# Patient Record
Sex: Female | Born: 1940 | Race: White | Hispanic: No | Smoking: Former smoker
Health system: Southern US, Community
[De-identification: ages and names within clinical notes are randomized; demographics above are authoritative.]

## PROBLEM LIST (undated history)

## (undated) DIAGNOSIS — I10 Essential (primary) hypertension: Secondary | ICD-10-CM

## (undated) DIAGNOSIS — F0390 Unspecified dementia without behavioral disturbance: Secondary | ICD-10-CM

## (undated) DIAGNOSIS — E785 Hyperlipidemia, unspecified: Secondary | ICD-10-CM

## (undated) DIAGNOSIS — N814 Uterovaginal prolapse, unspecified: Secondary | ICD-10-CM

## (undated) DIAGNOSIS — I059 Rheumatic mitral valve disease, unspecified: Secondary | ICD-10-CM

## (undated) DIAGNOSIS — F419 Anxiety disorder, unspecified: Secondary | ICD-10-CM

## (undated) DIAGNOSIS — N952 Postmenopausal atrophic vaginitis: Secondary | ICD-10-CM

## (undated) HISTORY — DX: Postmenopausal atrophic vaginitis: N95.2

## (undated) HISTORY — DX: Hyperlipidemia, unspecified: E78.5

## (undated) HISTORY — DX: Uterovaginal prolapse, unspecified: N81.4

## (undated) HISTORY — PX: CATARACT EXTRACTION, BILATERAL: SHX1313

## (undated) HISTORY — PX: TONSILLECTOMY AND ADENOIDECTOMY: SHX28

## (undated) HISTORY — DX: Anxiety disorder, unspecified: F41.9

## (undated) HISTORY — DX: Rheumatic mitral valve disease, unspecified: I05.9

## (undated) HISTORY — DX: Essential (primary) hypertension: I10

## (undated) HISTORY — PX: WISDOM TOOTH EXTRACTION: SHX21

## (undated) HISTORY — PX: SKIN BIOPSY: SHX1

## (undated) HISTORY — PX: TUBAL LIGATION: SHX77

---

## 1998-09-18 ENCOUNTER — Other Ambulatory Visit: Admission: RE | Admit: 1998-09-18 | Discharge: 1998-09-18 | Payer: Self-pay | Admitting: Obstetrics and Gynecology

## 1998-09-25 ENCOUNTER — Ambulatory Visit (HOSPITAL_COMMUNITY): Admission: RE | Admit: 1998-09-25 | Discharge: 1998-09-25 | Payer: Self-pay | Admitting: Family Medicine

## 1998-09-25 ENCOUNTER — Encounter: Payer: Self-pay | Admitting: Family Medicine

## 1999-11-09 ENCOUNTER — Other Ambulatory Visit: Admission: RE | Admit: 1999-11-09 | Discharge: 1999-11-09 | Payer: Self-pay | Admitting: Obstetrics and Gynecology

## 2000-11-16 ENCOUNTER — Other Ambulatory Visit: Admission: RE | Admit: 2000-11-16 | Discharge: 2000-11-16 | Payer: Self-pay | Admitting: Obstetrics and Gynecology

## 2001-11-21 ENCOUNTER — Other Ambulatory Visit: Admission: RE | Admit: 2001-11-21 | Discharge: 2001-11-21 | Payer: Self-pay | Admitting: Obstetrics and Gynecology

## 2002-06-25 ENCOUNTER — Encounter: Payer: Self-pay | Admitting: Family Medicine

## 2002-06-25 ENCOUNTER — Encounter: Admission: RE | Admit: 2002-06-25 | Discharge: 2002-06-25 | Payer: Self-pay | Admitting: Family Medicine

## 2002-11-25 ENCOUNTER — Other Ambulatory Visit: Admission: RE | Admit: 2002-11-25 | Discharge: 2002-11-25 | Payer: Self-pay | Admitting: Obstetrics and Gynecology

## 2002-11-28 ENCOUNTER — Encounter: Payer: Self-pay | Admitting: Family Medicine

## 2002-11-28 ENCOUNTER — Encounter: Admission: RE | Admit: 2002-11-28 | Discharge: 2002-11-28 | Payer: Self-pay | Admitting: Family Medicine

## 2003-08-27 ENCOUNTER — Encounter: Admission: RE | Admit: 2003-08-27 | Discharge: 2003-08-27 | Payer: Self-pay | Admitting: Family Medicine

## 2003-12-11 ENCOUNTER — Other Ambulatory Visit: Admission: RE | Admit: 2003-12-11 | Discharge: 2003-12-11 | Payer: Self-pay | Admitting: Obstetrics and Gynecology

## 2004-12-13 ENCOUNTER — Ambulatory Visit: Payer: Self-pay

## 2004-12-13 ENCOUNTER — Ambulatory Visit: Payer: Self-pay | Admitting: Internal Medicine

## 2004-12-23 ENCOUNTER — Ambulatory Visit: Payer: Self-pay

## 2005-02-24 ENCOUNTER — Other Ambulatory Visit: Admission: RE | Admit: 2005-02-24 | Discharge: 2005-02-24 | Payer: Self-pay | Admitting: Obstetrics and Gynecology

## 2005-03-01 ENCOUNTER — Ambulatory Visit: Payer: Self-pay | Admitting: Gastroenterology

## 2005-03-16 ENCOUNTER — Ambulatory Visit: Payer: Self-pay | Admitting: Gastroenterology

## 2005-04-19 ENCOUNTER — Ambulatory Visit: Payer: Self-pay | Admitting: Gastroenterology

## 2006-04-11 ENCOUNTER — Other Ambulatory Visit: Admission: RE | Admit: 2006-04-11 | Discharge: 2006-04-11 | Payer: Self-pay | Admitting: Obstetrics & Gynecology

## 2007-10-08 ENCOUNTER — Ambulatory Visit: Payer: Self-pay | Admitting: Internal Medicine

## 2007-10-08 ENCOUNTER — Observation Stay (HOSPITAL_COMMUNITY): Admission: EM | Admit: 2007-10-08 | Discharge: 2007-10-09 | Payer: Self-pay | Admitting: Emergency Medicine

## 2007-10-17 ENCOUNTER — Ambulatory Visit: Payer: Self-pay

## 2007-10-17 ENCOUNTER — Encounter: Payer: Self-pay | Admitting: Internal Medicine

## 2007-11-01 ENCOUNTER — Ambulatory Visit: Payer: Self-pay | Admitting: Internal Medicine

## 2008-07-04 DIAGNOSIS — I10 Essential (primary) hypertension: Secondary | ICD-10-CM

## 2008-07-04 HISTORY — DX: Essential (primary) hypertension: I10

## 2009-04-07 ENCOUNTER — Ambulatory Visit: Payer: Self-pay | Admitting: Internal Medicine

## 2009-04-07 DIAGNOSIS — I059 Rheumatic mitral valve disease, unspecified: Secondary | ICD-10-CM

## 2009-04-07 HISTORY — DX: Rheumatic mitral valve disease, unspecified: I05.9

## 2009-04-22 ENCOUNTER — Ambulatory Visit: Payer: Self-pay

## 2009-04-22 ENCOUNTER — Ambulatory Visit: Payer: Self-pay | Admitting: Cardiovascular Disease

## 2009-04-22 ENCOUNTER — Encounter: Payer: Self-pay | Admitting: Internal Medicine

## 2009-04-22 ENCOUNTER — Ambulatory Visit (HOSPITAL_COMMUNITY): Admission: RE | Admit: 2009-04-22 | Discharge: 2009-04-22 | Payer: Self-pay | Admitting: Cardiovascular Disease

## 2009-09-01 DIAGNOSIS — N814 Uterovaginal prolapse, unspecified: Secondary | ICD-10-CM

## 2009-09-01 HISTORY — DX: Uterovaginal prolapse, unspecified: N81.4

## 2010-10-08 ENCOUNTER — Other Ambulatory Visit: Payer: Self-pay | Admitting: Internal Medicine

## 2010-11-16 NOTE — Discharge Summary (Signed)
NAME:  Shari Kelly, Shari Kelly              ACCOUNT NO.:  [id number]   MEDICAL RECORD NO.:  [id number]          PATIENT TYPE:  OBV   LOCATION:  3711                         FACILITY:  [facility]   PHYSICIAN:  Everardo Beals. Juanda Chance, MD, FACCDATE OF BIRTH:  [birth year]   DATE OF ADMISSION:  10/08/2007  DATE OF DISCHARGE:  10/09/2007                               DISCHARGE SUMMARY   PRIMARY CARDIOLOGIST:  Duke Salvia, MD, [facility]   PRIMARY CARE PHYSICIAN:  Rodolph Bong, MD   PROCEDURES PERFORMED DURING HOSPITALIZATION:  None.   FINAL DISCHARGE DIAGNOSES:  1. Atypical chest pain.  2. Hypertension.  3. Hypercholesterolemia.  4. Rule out gastroesophageal reflux disease.   HISTORY OF PRESENT ILLNESS:  This is a very pleasant 70 year old  Caucasian female with a history of irregular heart beat and  palpitation.  She saw Dr. Graciela Husbands approximately 3 years ago who presented  to the emergency room after endorsing 2 weeks of recurring chest  discomfort, occurring every day 2 to 3 times a day with and without  exertion.  She described it as pressure and has found it harder to take  deep breath.  The patient states over the last 2 days, she has noticed  increasing chest pressure and also fatigue.  She states that she laid  around all weekend and it was uncomfortable for her to get up and walk  around secondary to pressure in her chest, which was relieved with rest.  The patient was seen by her primary care physician and did have some  moderate hypertension with the blood pressure of 164.  They thought it  might be white code syndrome and they were monitoring this, and she was  to follow up the day of this admission with her primary care physician  to evaluate blood pressure.   The patient did present to the emergency room and cardiac enzymes were  cycled and found to be negative.  EKG revealed normal sinus rhythm.  The  patient was evaluated for amylase and lipase, which were found to be  negative.  She was  hypertensive on admission with the blood pressure of  190/86, pulse 107, and respirations 18.  The patient was admitted to  rule out myocardial infarction and was found to be negative.   The patient was placed on Norvasc 5 mg 1 p.o. every day, and blood  pressure normalized.  The patient's blood pressure on discharge was  119/67, heart rate 62, and respirations 18.  The patient had no further  complaints with chest pressure.  She admitted to still burping and some  abdominal discomfort, although this was very transient.  The patient  denied any recurrence of symptoms which brought her in.  The patient was  seen and examined by Dr. Charlies Constable on the day of discharge and found  to be stable.  The patient requested outpatient stress Myoview and  followup as necessary.  The patient will be sent home with a low-dose  statin as her total cholesterol was 224 and her LDL was a 139, was also  discussed need for followup echocardiogram as well to  evaluate LV  function in the setting of hypertension.   DISCHARGE LABS:  Troponin negative x3 at less than 0.05, 0.01, and 0.01  respectively.  TSH 1.133.  Amylase 48, lipase 31.  Total cholesterol  224, triglycerides 83, HDL 69, LDL 139, and D-dimer 0.22.  EKG revealing  normal sinus rhythm with incomplete right bundle-branch block.   DISCHARGE MEDICATIONS:  1. Norvasc 5 mg 1 p.o. daily (new prescription provided).  2. Protonix 40 mg daily ( new prescription provided).  3. Simvastatin 20 mg daily (new prescription provided).  4. Glucosamine twice a day as at home.  5. Calcium 500 mg twice as at home.  6. Multivitamin daily as at home.  7. Aspirin 81 mg daily as at home.  8. Fish oil daily as at home.   ALLERGIES:  PENICILLIN causing hives and mouth ulcers.   FOLLOWUP PLANS AND APPOINTMENT:  1. The patient will be scheduled for a stress Myoview on October 17, 2007, at 9 a.m.  2. The patient will be followed by Dr. Sherryl Manges on November 01, 2007,      at 9 a.m.  The patient is scheduled for an echocardiogram on October 17, 2007, at 7:30 a.m. to evaluate LV function in the setting of      hypertension.  3. The patient is to follow up with her primary care physician, Dr.      Penni Bombard for continued medical management.  4. The patient has been advised to follow up with lipids and LFTs in 6      weeks, secondary to newly prescribed simvastatin.  5. The patient is to continue current exercise regimen and to stop if      she has recurrent symptoms.   TIME SPENT WITH THE PATIENT TO INCLUDE PHYSICIAN TIME:  45 minutes.      Bettey Mare. Lyman Bishop, NP      Everardo Beals. Juanda Chance, MD, [facility]  Electronically Signed    KML/MEDQ  D:  10/09/2007  T:  10/10/2007  Job:  [id number]   cc:   Rodolph Bong, M.D.

## 2010-11-16 NOTE — Assessment & Plan Note (Signed)
[facility]                         ELECTROPHYSIOLOGY OFFICE NOTE   NAME:Shari Kelly, Shari Kelly                       MRN:          [id number]  DATE:11/01/2007                            DOB:          1940-09-03    Ms. Dibbern is seen following recent hospitalization for chest pain.  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  This was new from 2006.  Her chest discomforts are  better.  They are described as mostly at rest.  They are not associated  with a brackish taste and not particularly recumbent.   This is improved since being discharged on her Protonix.   She has also had some palpitations.  We has seen her previously for  atrial bigeminy and atrial tachycardia.  This is bothering her mildly  when she went to the hospital.   MEDICATIONS:  1. Simvastatin 20.  2. Protonix 40.  3. Amlodipine 5.  4. Aspirin   FAMILY HISTORY:  Coronary disease.   PHYSICAL EXAMINATION:  VITAL SIGNS:  Blood pressure 152/68, but she  brings with her her blood pressure monitor in which the vast majority of  her numbers are between 120 and 130 with occasional 140, a rare 150, and  the occasion 110s.  LUNGS:  Clear.  HEART:  The heart sounds were regular.  NECK:  Veins were flat.  EXTREMITIES:  Without edema.  ABDOMEN:  Soft.  SKIN:  Warm and dry.   Electrocardiogram dated October 08, 2007 demonstrated sinus tachycardia, on  October 09, 2007 demonstrated normal rhythm.  P wave morphologies were  relatively consistent of the two tracings.   IMPRESSION:  1. Noncardiac chest pain, probably gastroesophageal reflux disease.  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  3. Hypertension.  4. Dyslipidemia.   Ms.  Beasley is doing quite well at this point.  We will continue her on  her Protonix.  We will plan to see her again in about 18 months to  reassess her mitral valve, given the fact that it was not apparent 3  years ago and is apparent now.   In addition, I have suggested that she discontinue her simvastatin, and  she can try red yeast rice for her LDL of 139.  She will continue her  amlodipine for her blood pressure and continue to home monitor.  I think  she has a little bit of white coat hypertension.   She also raised the question about whether she has anxiety that may be  contributing to the aforementioned.  Her sisters raised this with her as  well.  The patient does not feel like this is an issue, so I suggest  that she continue to consider this, and if anxiety is an issue that  calcium may be of some benefit.     Duke Salvia, MD, [facility]  Electronically Signed    SCK/MedQ  DD:  11/01/2007  DT: 11/01/2007  Job #: [id number]   cc:   Quita Skye. Artis Flock, M.D.

## 2010-11-16 NOTE — H&P (Signed)
NAME:  Shari Kelly, Shari Kelly NO.:  [id number]   MEDICAL RECORD NO.:  [id number]          PATIENT TYPE:  OBV   LOCATION:  3711                         FACILITY:  [facility]   PHYSICIAN:  Pricilla Riffle, MD, FACCDATE OF BIRTH:  July 31, 1940   DATE OF ADMISSION:  10/08/2007  DATE OF DISCHARGE:                              HISTORY & PHYSICAL   IDENTIFICATION:  The patient is a 70 year old who presents today with  chest pain.   HISTORY OF PRESENT ILLNESS:  The patient has a history of irregular  heartbeat and palpitations seen by Berton Mount 3 years ago though we do  not have records.  About 2 weeks ago, she began experiencing episodes of  chest pressure.  She points to the substernal region into the  epigastrium that occurred two to three times per day not associated with  any particular activity associated though with some shortness of breath.  She described that it was a little harder to take a deep breath.  Episodes would last about 30 minutes to 45 minutes and then go away on  own.  She has noted some increased indigestion with burping more.  This  past Tuesday, she had a physical examination.  Blood pressure was  164/78.  Plan was to monitor, no treatment.  On Saturday, she got up  with chest pressure worse than before.  She to laid around on the sofa  most of the day.  It would go away.  She would get up and it would come  right back.  She slept okay Saturday night and on Sunday again, she had  the same thing.  This morning when she woke up, she called her doctor  and was told to come to the emergency room.  She denies any bitter taste  in her mouth.  No constipation or diarrhea.  No palpitations.  She says  when she is talking about the symptoms, they tend to bother her more.   ALLERGIES:  PENICILLIN LEADING TO HIVES.   MEDICATIONS ON ADMISSION:  Aspirin 81, calcium, fish oil, multivitamin,  low-dose Premarin and Remifemin (black cohosh) since Wednesday.   PAST MEDICAL  HISTORY:  DJD.   PAST SURGICAL HISTORY:  Negative.   SOCIAL HISTORY:  The patient lives in [place].  She is a retired Psychologist, forensic.  She quit smoking 3 years ago after a bout of 42-pack year.  Drinks 4 ounces of wine per day.  Exercises with Pilates which are more  stationary.  Does walk on the elliptical 30 minutes but has not done in  several days.   FAMILY HISTORY:  Mother died of old age.  She had colon cancer,  hypertension.  Father died in his 27s.  Had his first MI in his 45s.  Two sisters without disease.   REVIEW OF SYSTEMS:  Notes hot flashes.  Otherwise, all systems reviewed  negative to the above problem except as noted above.   PHYSICAL EXAM:  GENERAL:  The patient is in no distress.  VITAL SIGNS:  Blood pressure is 190/86, pulse is 107 and regular,  temperature  is 98.4, O2 sat on room air is 98%.  HEENT: Normocephalic, atraumatic.  EOMI.  PERRL.  Mucous membranes are  moist.  NECK:  No bruits.  JVP is normal.  No thyromegaly.  LUNGS:  Lungs are clear to auscultation without wheezes or rales.  CARDIAC EXAMINATION:  Regular rate and rhythm.  S1-S2.  No S3 or S4 or  murmurs.  ABDOMEN:  Benign.  No hepatomegaly.  Normal bowel sounds.  EXTREMITIES:  Good distal pulses throughout.  No lower extremity edema.  NEUROLOGICAL EXAMINATION:  Alert and oriented x3.  Cranial nerves II-XII  are grossly intact.  Moving all extremities.   LABORATORY DATA:  Chest x-ray shows no acute disease.  A 12-lead EKG  shows sinus tachycardia at 107 beats per minute.  No acute ST changes.  Labs significant for hemoglobin of 15, WBC of 7.2, BUN and creatinine of  19 and 1.2.  Potassium of 3.7.  Initial cardiac markers negative.   IMPRESSION:  1. The patient is a 70 year old with no known history of CAD, though      has a remote tobacco and also family history of CAD.  Comes in with      chest pain for the past 2 weeks.  It is atypical and occurs with      and without activity.  She has cut  back some on her activities      because of this.  Also associated with some shortness of breath.      On exam, there is no active findings.  EKG was without acute      changes.  Blood work so far is negative.  I have discussed the      options with the patient.  Will plan to rule out MI.  I have      discussed with the patient if enzymes are negative, she would      prefer Myoview.  If the enzymes are positive, will of course      schedule catheterization.  Will check D-dimer.  Check lipids.      Treat empirically with Protonix.  2. Hypertension.  Begin treatment with Norvasc.  This may be      contributing to her symptoms.  3. Healthcare maintenance.  Again, check lipids.  4. Palpitations, stable.  Patient on telemetry.      Pricilla Riffle, MD, [facility]  Electronically Signed     PVR/MEDQ  D:  10/08/2007  T:  10/09/2007  Job:  [id number]   cc:   Quita Skye. Artis Flock, M.D.

## 2011-03-29 LAB — BASIC METABOLIC PANEL
BUN: 14
CO2: 25
Calcium: 10
Creatinine, Ser: 0.81
GFR calc Af Amer: >60
Glucose, Bld: 99

## 2011-03-29 LAB — CARDIAC PANEL(CRET KIN+CKTOT+MB+TROPI)
CK, MB: 3.1
Relative Index: 2.2
Total CK: 142
Troponin I: 0.01

## 2011-03-29 LAB — DIFFERENTIAL
Basophils Absolute: 0
Eosinophils Relative: 2
Lymphocytes Relative: 34
Neutro Abs: 4.1
Neutrophils Relative %: 56

## 2011-03-29 LAB — LIPID PANEL
HDL: 69
LDL Cholesterol: 138 — ABNORMAL HIGH
Triglycerides: 83
VLDL: 17

## 2011-03-29 LAB — POCT CARDIAC MARKERS
Myoglobin, poc: 79.4
Operator id: 288331

## 2011-03-29 LAB — CBC
HCT: 44.5
Platelets: 274
RDW: 12.8

## 2011-03-29 LAB — POCT I-STAT, CHEM 8
BUN: 19
Sodium: 139
TCO2: 29

## 2011-03-29 LAB — AMYLASE: Amylase: 48

## 2011-03-29 LAB — D-DIMER, QUANTITATIVE: D-Dimer, Quant: <0.22

## 2011-05-04 ENCOUNTER — Other Ambulatory Visit: Payer: Self-pay | Admitting: Internal Medicine

## 2011-05-27 ENCOUNTER — Encounter: Payer: Self-pay | Admitting: Internal Medicine

## 2011-06-01 ENCOUNTER — Ambulatory Visit: Payer: Self-pay | Admitting: Internal Medicine

## 2011-06-21 ENCOUNTER — Encounter: Payer: Self-pay | Admitting: Internal Medicine

## 2011-06-22 ENCOUNTER — Encounter: Payer: Self-pay | Admitting: Internal Medicine

## 2011-06-22 ENCOUNTER — Ambulatory Visit (INDEPENDENT_AMBULATORY_CARE_PROVIDER_SITE_OTHER): Payer: Medicare Other | Admitting: Internal Medicine

## 2011-06-22 VITALS — BP 165/77 | HR 70 | Ht 65.0 in | Wt 153.0 lb

## 2011-06-22 DIAGNOSIS — I10 Essential (primary) hypertension: Secondary | ICD-10-CM

## 2011-06-22 DIAGNOSIS — I059 Rheumatic mitral valve disease, unspecified: Secondary | ICD-10-CM

## 2011-06-22 MED ORDER — AMLODIPINE BESYLATE 5 MG PO TABS: 5.0000 mg | ORAL_TABLET | Freq: Every day | ORAL | Status: DC

## 2011-06-22 NOTE — Progress Notes (Signed)
  HPI  Shari Kelly is a 70 y.o. female Seen in followup for atypical chest pain and mitral valve issues detected by echo. In 2009 she had an echo demonstrating mitral valve prolapse  Past Medical History  Diagnosis Date  . 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 HTN (hypertension)   . Dyslipidemia     No past surgical history on file.  Current Outpatient Prescriptions  Medication Sig Dispense Refill  . aspirin 81 MG tablet Take 81 mg by mouth daily.        . Biotin 5 MG CAPS Take 1 capsule by mouth daily.        . Black Cohosh 540 MG CAPS Take 1 capsule by mouth daily.        . calcium-vitamin D (OSCAL) 250-125 MG-UNIT per tablet Take 1 tablet by mouth daily.       Marland Kitchen ESTRACE VAGINAL 0.1 MG/GM vaginal cream       . fish oil-omega-3 fatty acids 1000 MG capsule Take 2 g by mouth daily.        . mometasone (ELOCON) 0.1 % cream Apply 1 application topically daily.        Marland Kitchen ZOSTAVAX [address] UNT/0.65ML injection       . amLODipine (NORVASC) 5 MG tablet TAKE 1 TABLET BY MOUTH EVERY DAY  30 tablet  0    Allergies  Allergen Reactions  . Penicillins     REACTION: rash    Review of Systems negative except from HPI and PMH  Physical Exam Well developed and well nourished in no acute distress HENT normal E scleral and icterus clear Neck Supple JVP flat; carotids brisk and full Clear to ausculation Regular rate and rhythm, no murmurs gallops or rub Soft with active bowel sounds No clubbing cyanosis none Edema Alert and oriented, grossly normal motor and sensory function Skin Warm and Dry  Sinus at 70  .15/.08/.39   Assessment and  Plan

## 2011-06-22 NOTE — Assessment & Plan Note (Addendum)
Dr. Myrtis Ser reviewed the ultrasound from 2010 and compared it with the report from 2009. There are discrepancies and there appears to be some degree of restriction of the posterior leaflet as described in 2010. The study was not a great study, however, and I think the likelihood of significant valve disease is low, this discrepancies Prompt consideration for repeat studies  I have spoken with the patient and we will plan to review the echo in one years time

## 2011-06-22 NOTE — Patient Instructions (Signed)
Your physician recommends that you continue on your current medications as directed. Please refer to the Current Medication list given to you today.  Dr. Graciela Husbands will review your echocardiogram results with his partners. We will be in touch with you after that.

## 2011-08-05 DIAGNOSIS — E785 Hyperlipidemia, unspecified: Secondary | ICD-10-CM

## 2011-08-05 HISTORY — DX: Hyperlipidemia, unspecified: E78.5

## 2011-08-25 ENCOUNTER — Telehealth: Payer: Self-pay | Admitting: Internal Medicine

## 2011-08-25 ENCOUNTER — Other Ambulatory Visit: Payer: Self-pay | Admitting: *Deleted

## 2011-08-25 DIAGNOSIS — I10 Essential (primary) hypertension: Secondary | ICD-10-CM

## 2011-08-25 MED ORDER — AMLODIPINE BESYLATE 5 MG PO TABS: 5.0000 mg | ORAL_TABLET | Freq: Every day | ORAL | Status: DC

## 2011-08-25 NOTE — Telephone Encounter (Signed)
Refills sent in

## 2011-08-25 NOTE — Telephone Encounter (Signed)
New problem:  Patient just pick her medication of amlodipine 5 mg. On the bottle is state no more refills without Md authorization. pls advise.

## 2011-12-13 LAB — HM MAMMOGRAPHY: HM Mammogram: NORMAL

## 2011-12-21 ENCOUNTER — Other Ambulatory Visit: Payer: Self-pay | Admitting: *Deleted

## 2011-12-21 ENCOUNTER — Telehealth: Payer: Self-pay | Admitting: Gastroenterology

## 2011-12-21 DIAGNOSIS — B1920 Unspecified viral hepatitis C without hepatic coma: Secondary | ICD-10-CM

## 2011-12-21 NOTE — Telephone Encounter (Signed)
lmom for pt to call back

## 2011-12-21 NOTE — Telephone Encounter (Signed)
GOOD IDEA

## 2011-12-21 NOTE — Telephone Encounter (Signed)
Pt agreed to come for PV and have Direct COLON on 01/09/12 at 2:30pm; pt's only concern was whether it would be paid for. Explained to pt we will have the procedure pre certed and with her family hx, it should be covered; pt stated understanding.

## 2011-12-21 NOTE — Telephone Encounter (Signed)
Dr Valentina Lucks wants pt to have COLON more frequent than q10 years because pt's mom had Colon Cancer. Last COLON 2006 and RECALL in EPIC for 2016. Pt has no GI issues at present, Dr Valentina Lucks feels it needs to be done every 5 years and he is happy to discuss this with you. OK to schedule a DIRECT COLON or OV 1st? Thanks.

## 2011-12-27 ENCOUNTER — Ambulatory Visit (AMBULATORY_SURGERY_CENTER): Payer: Medicare Other

## 2011-12-27 VITALS — Ht 63.0 in | Wt 154.5 lb

## 2011-12-27 DIAGNOSIS — Z09 Encounter for follow-up examination after completed treatment for conditions other than malignant neoplasm: Secondary | ICD-10-CM

## 2011-12-27 DIAGNOSIS — Z800 Family history of malignant neoplasm of digestive organs: Secondary | ICD-10-CM

## 2011-12-27 MED ORDER — MOVIPREP 100 G PO SOLR: ORAL | Status: DC

## 2012-01-09 ENCOUNTER — Encounter: Payer: PRIVATE HEALTH INSURANCE | Admitting: Gastroenterology

## 2012-01-25 ENCOUNTER — Ambulatory Visit (AMBULATORY_SURGERY_CENTER): Payer: Medicare Other | Admitting: Gastroenterology

## 2012-01-25 ENCOUNTER — Encounter: Payer: Self-pay | Admitting: Gastroenterology

## 2012-01-25 VITALS — BP 174/85 | HR 98 | Temp 96.1°F | Resp 30 | Ht 63.0 in | Wt 154.0 lb

## 2012-01-25 DIAGNOSIS — Z800 Family history of malignant neoplasm of digestive organs: Secondary | ICD-10-CM

## 2012-01-25 DIAGNOSIS — Z1211 Encounter for screening for malignant neoplasm of colon: Secondary | ICD-10-CM

## 2012-01-25 HISTORY — PX: COLONOSCOPY: SHX174

## 2012-01-25 MED ORDER — SODIUM CHLORIDE 0.9 % IV SOLN: 500.0000 mL | INTRAVENOUS | Status: DC

## 2012-01-25 NOTE — Progress Notes (Signed)
Patient did not experience any of the following events: a burn prior to discharge; a fall within the facility; wrong site/side/patient/procedure/implant event; or a hospital transfer or hospital admission upon discharge from the facility. (G8907) Patient did not have preoperative order for IV antibiotic SSI prophylaxis. (G8918)  

## 2012-01-25 NOTE — Op Note (Signed)
[facility] [address] [place], [place]  [address]  COLONOSCOPY PROCEDURE REPORT  PATIENT:  Shari Kelly, Shari Kelly  MR#:  [id number] BIRTHDATE:  [birth year], 71 yrs. old  GENDER:  female ENDOSCOPIST:  Vania Rea. Jarold Motto, MD, [facility] REF. BY:  Kirby Funk, M.D. PROCEDURE DATE:  01/25/2012 PROCEDURE:  Higher-risk screening colonoscopy G0105  ASA CLASS:  Class II INDICATIONS:  family history of colon cancer MEDICATIONS:   propofol (Diprivan) 300 mg IV  DESCRIPTION OF PROCEDURE:   After the risks and benefits and of the procedure were explained, informed consent was obtained. Digital rectal exam was performed and revealed no abnormalities. The LB CF-[id number] endoscope was introduced through the anus and advanced to the cecum, which was identified by both the appendix and ileocecal valve.  The quality of the prep was good, using MoviPrep.  The instrument was then slowly withdrawn as the colon was fully examined. <<PROCEDUREIMAGES>>  FINDINGS:  No polyps or cancers were seen.  This was otherwise a normal examination of the colon. VERY TORTUOUS,LONG, AND REDUNDANT COLON.   Retroflexed views in the rectum revealed no abnormalities.    The scope was then withdrawn from the patient and the procedure completed.  COMPLICATIONS:  None ENDOSCOPIC IMPRESSION: 1) No polyps or cancers 2) Otherwise normal examination RECOMMENDATIONS: 1) Repeat Colonoscopy in 3 years. A SMALL POLYP VISUALIZED WITH INSERTION,COULD NOT VISUALIZE ON WITHDRAWAL.  REPEAT EXAM:  No  ______________________________ Vania Rea. Jarold Motto, MD, [facility]  CC:  n. eSIGNED:   Vania Rea. Tyshae Stair at 01/25/2012 02:15 PM  Montez Morita, [id number]

## 2012-01-25 NOTE — Patient Instructions (Addendum)
YOU HAD AN ENDOSCOPIC PROCEDURE TODAY AT THE [facility]: Refer to the procedure report that was given to you for any specific questions about what was found during the examination.  If the procedure report does not answer your questions, please call your gastroenterologist to clarify.  If you requested that your care partner not be given the details of your procedure findings, then the procedure report has been included in a sealed envelope for you to review at your convenience later.  YOU SHOULD EXPECT: Some feelings of bloating in the abdomen. Passage of more gas than usual.  Walking can help get rid of the air that was put into your GI tract during the procedure and reduce the bloating. If you had a lower endoscopy (such as a colonoscopy or flexible sigmoidoscopy) you may notice spotting of blood in your stool or on the toilet paper. If you underwent a bowel prep for your procedure, then you may not have a normal bowel movement for a few days.  DIET: Your first meal following the procedure should be a light meal and then it is ok to progress to your normal diet.  A half-sandwich or bowl of soup is an example of a good first meal.  Heavy or fried foods are harder to digest and may make you feel nauseous or bloated.  Likewise meals heavy in dairy and vegetables can cause extra gas to form and this can also increase the bloating.  Drink plenty of fluids but you should avoid alcoholic beverages for 24 hours.  ACTIVITY: Your care partner should take you home directly after the procedure.  You should plan to take it easy, moving slowly for the rest of the day.  You can resume normal activity the day after the procedure however you should NOT DRIVE or use heavy machinery for 24 hours (because of the sedation medicines used during the test).    SYMPTOMS TO REPORT IMMEDIATELY: A gastroenterologist can be reached at any hour.  During normal business hours, 8:30 AM to 5:00 PM Monday through Friday,  call [phone].  After hours and on weekends, please call the GI answering service at [phone] who will take a message and have the physician on call contact you.   Following lower endoscopy (colonoscopy or flexible sigmoidoscopy):  Excessive amounts of blood in the stool  Significant tenderness or worsening of abdominal pains  Swelling of the abdomen that is new, acute  Fever of 100F or higher  Following upper endoscopy (EGD)  Vomiting of blood or coffee ground material  New chest pain or pain under the shoulder blades  Painful or persistently difficult swallowing  New shortness of breath  Fever of 100F or higher  Black, tarry-looking stools  FOLLOW UP: If any biopsies were taken you will be contacted by phone or by letter within the next 1-3 weeks.  Call your gastroenterologist if you have not heard about the biopsies in 3 weeks.  Our staff will call the home number listed on your records the next business day following your procedure to check on you and address any questions or concerns that you may have at that time regarding the information given to you following your procedure. This is a courtesy call and so if there is no answer at the home number and we have not heard from you through the emergency physician on call, we will assume that you have returned to your regular daily activities without incident.  SIGNATURES/CONFIDENTIALITY: You and/or your care   partner have signed paperwork which will be entered into your electronic medical record.  These signatures attest to the fact that that the information above on your After Visit Summary has been reviewed and is understood.  Full responsibility of the confidentiality of this discharge information lies with you and/or your care-partner.  

## 2012-01-26 ENCOUNTER — Telehealth: Payer: Self-pay

## 2012-01-26 NOTE — Telephone Encounter (Signed)
  Follow up Call-  Call back number 01/25/2012  Post procedure Call Back phone  # [phone]  Permission to leave phone message Yes     Patient questions:  Do you have a fever, pain , or abdominal swelling? no Pain Score  0 *  Have you tolerated food without any problems? yes  Have you been able to return to your normal activities? yes  Do you have any questions about your discharge instructions: Diet   no Medications  no Follow up visit  no  Do you have questions or concerns about your Care? no  Actions: * If pain score is 4 or above: No action needed, pain <4.  No problems per the pt. Maw

## 2012-05-29 ENCOUNTER — Telehealth: Payer: Self-pay | Admitting: Internal Medicine

## 2012-05-29 ENCOUNTER — Other Ambulatory Visit: Payer: Self-pay | Admitting: *Deleted

## 2012-05-29 DIAGNOSIS — I059 Rheumatic mitral valve disease, unspecified: Secondary | ICD-10-CM

## 2012-05-29 NOTE — Telephone Encounter (Signed)
Echo ordered per Dr. Graciela Husbands.

## 2012-05-29 NOTE — Telephone Encounter (Signed)
New problem:   Patient need to be set up for echo on same day in Jan.

## 2012-07-30 ENCOUNTER — Ambulatory Visit (HOSPITAL_COMMUNITY): Payer: Medicare Other | Attending: Cardiovascular Disease | Admitting: Radiology

## 2012-07-30 DIAGNOSIS — E785 Hyperlipidemia, unspecified: Secondary | ICD-10-CM | POA: Insufficient documentation

## 2012-07-30 DIAGNOSIS — I10 Essential (primary) hypertension: Secondary | ICD-10-CM | POA: Insufficient documentation

## 2012-07-30 DIAGNOSIS — I079 Rheumatic tricuspid valve disease, unspecified: Secondary | ICD-10-CM | POA: Insufficient documentation

## 2012-07-30 DIAGNOSIS — I059 Rheumatic mitral valve disease, unspecified: Secondary | ICD-10-CM | POA: Insufficient documentation

## 2012-07-30 NOTE — Progress Notes (Signed)
Echocardiogram performed.  

## 2012-08-02 ENCOUNTER — Ambulatory Visit (INDEPENDENT_AMBULATORY_CARE_PROVIDER_SITE_OTHER): Payer: Medicare Other | Admitting: Internal Medicine

## 2012-08-02 ENCOUNTER — Other Ambulatory Visit: Payer: Self-pay

## 2012-08-02 ENCOUNTER — Encounter: Payer: Self-pay | Admitting: Internal Medicine

## 2012-08-02 VITALS — BP 156/66 | HR 75 | Ht 64.0 in | Wt 156.8 lb

## 2012-08-02 DIAGNOSIS — I10 Essential (primary) hypertension: Secondary | ICD-10-CM

## 2012-08-02 DIAGNOSIS — I059 Rheumatic mitral valve disease, unspecified: Secondary | ICD-10-CM

## 2012-08-02 MED ORDER — AMLODIPINE BESYLATE 5 MG PO TABS: 5.0000 mg | ORAL_TABLET | Freq: Every day | ORAL | Status: DC

## 2012-08-02 NOTE — Assessment & Plan Note (Signed)
Repeat assessment of the mitral valve continues to demonstrate no abnormality. I suspect original reading may have been over a call. We will see her again as needed

## 2012-08-02 NOTE — Assessment & Plan Note (Signed)
Blood pressure is mildly elevated. She will follow up with her PCP

## 2012-08-02 NOTE — Patient Instructions (Signed)
1. Continue current medications.  2. Follow up with Dr. Graciela Husbands as needed.

## 2012-08-02 NOTE — Progress Notes (Signed)
  HPI  Shari Kelly is a 72 y.o. female Seen in followup for atypical chest pain and mitral valve issues detected by echo. In 2009 she had an echo demonstrating mitral valve prolapse  repeat ultrasound this week demonstrated normal left ventricular function; she had normal mobility of the mitral valve without prolapse identified. There is mild regurgitation. Left atrial dimensions were normal  The patient denies chest pain, shortness of breath, nocturnal dyspnea, orthopnea or peripheral edema.  There have been no palpitations, lightheadedness or syncope.    Past Medical History  Diagnosis Date  . 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 HTN (hypertension)   . Dyslipidemia   . Hyperlipidemia     Past Surgical History  Procedure Date  . Tonsillectomy and adenoidectomy   . Tubal ligation   . Colonoscopy     Current Outpatient Prescriptions  Medication Sig Dispense Refill  . amLODipine (NORVASC) 5 MG tablet Take 1 tablet (5 mg total) by mouth daily.  30 tablet  11  . aspirin 81 MG tablet Take 81 mg by mouth daily.        . Black Cohosh 540 MG CAPS Take 1 capsule by mouth daily.        . calcium-vitamin D (OSCAL) 250-125 MG-UNIT per tablet Take 2 tablets by mouth daily. Take calcium 600mg   with vit d 400 units daily      . ESTRACE VAGINAL 0.1 MG/GM vaginal cream       . pravastatin (PRAVACHOL) 40 MG tablet Take 40 mg by mouth daily.      Marland Kitchen ZOSTAVAX 16109 UNT/0.65ML injection         Allergies  Allergen Reactions  . Penicillins     REACTION: rash,mouth ulcers    Review of Systems negative except from HPI and PMH  Physical Exam BP 156/66  Pulse 75  Ht 5\' 4"  (1.626 m)  Wt 156 lb 12 oz (71.101 kg)  BMI 26.91 kg/m2  Well developed and well nourished in no acute distress HENT normal E scleral and icterus clear Neck Supple JVP flat; carotids brisk and full Clear to ausculation Regular rate and rhythm, no murmurs gallops or rub Soft with active bowel sounds No clubbing  cyanosis none Edema Alert and oriented, grossly normal motor and sensory function Skin Warm and Dry  ECG demonstrates sinus rhythm at 72 Intervals 15/0 838 Axis is leftward at -13 RSR prime Sinus at 70  .15/.08/.39   Assessment and  Plan

## 2012-08-18 ENCOUNTER — Other Ambulatory Visit: Payer: Self-pay

## 2012-09-21 ENCOUNTER — Other Ambulatory Visit: Payer: Self-pay | Admitting: *Deleted

## 2012-09-21 DIAGNOSIS — I10 Essential (primary) hypertension: Secondary | ICD-10-CM

## 2012-09-21 MED ORDER — AMLODIPINE BESYLATE 5 MG PO TABS: 5.0000 mg | ORAL_TABLET | Freq: Every day | ORAL | Status: DC

## 2012-09-21 NOTE — Telephone Encounter (Signed)
Refilled Amlodipine sent to [facility] pharmacy.

## 2012-11-12 ENCOUNTER — Encounter: Payer: Self-pay | Admitting: *Deleted

## 2012-11-13 ENCOUNTER — Ambulatory Visit (INDEPENDENT_AMBULATORY_CARE_PROVIDER_SITE_OTHER): Payer: Medicare Other | Admitting: Nurse Practitioner

## 2012-11-13 ENCOUNTER — Encounter: Payer: Self-pay | Admitting: Nurse Practitioner

## 2012-11-13 VITALS — BP 110/52 | HR 64 | Resp 14 | Ht 64.0 in | Wt 155.0 lb

## 2012-11-13 DIAGNOSIS — N814 Uterovaginal prolapse, unspecified: Secondary | ICD-10-CM

## 2012-11-13 NOTE — Patient Instructions (Addendum)
Place cystocele, rectocele, and pessary patient instructions here.   Return in 3 months

## 2012-11-13 NOTE — Progress Notes (Signed)
72 y.o. Divorced White female [id number] here for pessary check.  Patient has been using following pessary style and size:  3" dish pessary.  She is not sexually active.  She describes the following issues with the pessary: none.  Denies any urinary symptoms.  She is planning a trip this fall - maybe to [place].  .  ROS: no breast pain or new or enlarging lumps on self exam, no discharge or pelvic pain, no dysuria, trouble voiding or hematuria  Exam:   BP 110/52  Pulse 64  Resp 14  Ht 5\' 4"  (1.626 m)  Wt 155 lb (70.308 kg)  BMI 26.59 kg/m2 General appearance: alert, cooperative and appears stated age Inguinal adenopathy: normal   Pelvic: External genitalia:  no lesions and well estrogenic              Urethra: normal appearing urethra with no masses, tenderness or lesions              Bartholin's and Skene's: normal                 Vagina: normal appearing vagina with normal color and discharge, no lesions, pessary is removed              Cervix: normal appearance Bimanual Exam:  Uterus:  uterus is normal size, shape, consistency and non tender                               Adnexa:    normal adnexa in size, non tender and no masses                               Anus:  defer exam  Pessary was removed without difficulty.  Pessary was cleansed.  Pessary was replaced. Patient tolerated procedure well.    A: SUI, Urge incontinence, Cystocele- asymptomatic with use of pessary       P:  Return to office in 3 months for recheck.            An After Visit Summary was printed and given to the patient.

## 2012-11-14 NOTE — Progress Notes (Signed)
Encounter reviewed by Dr. Parris Cudworth Silva.  

## 2013-02-06 ENCOUNTER — Other Ambulatory Visit: Payer: Self-pay

## 2013-02-14 ENCOUNTER — Ambulatory Visit (INDEPENDENT_AMBULATORY_CARE_PROVIDER_SITE_OTHER): Payer: Medicare Other | Admitting: Nurse Practitioner

## 2013-02-14 ENCOUNTER — Encounter: Payer: Self-pay | Admitting: Nurse Practitioner

## 2013-02-14 VITALS — BP 132/74 | HR 76 | Resp 12 | Ht 64.0 in | Wt 155.4 lb

## 2013-02-14 DIAGNOSIS — Z01419 Encounter for gynecological examination (general) (routine) without abnormal findings: Secondary | ICD-10-CM

## 2013-02-14 MED ORDER — ESTRADIOL 0.1 MG/GM VA CREA: 1.0000 g | TOPICAL_CREAM | VAGINAL | Status: DC

## 2013-02-14 NOTE — Patient Instructions (Addendum)

## 2013-02-14 NOTE — Progress Notes (Signed)
72 y.o. [id number] Divorced Caucasian Fe here for annual exam.  She feels well and is doing good with pessary.  No LMP recorded. Patient is postmenopausal.          Sexually active: no  The current method of family planning is post menopausal status.    Exercising: yes  cardio and weight maintenance  Smoker:  no  Health Maintenance: Pap:  11/10/2009  negative MMG:  01/22/13 normal Colonoscopy:  01/25/2012 BMD:   12/21/2010 T Score:  Spine 4.8/ left femur neck 0.6/ total 1.7 /radius total 0.5 TDaP:  09/14/2009 Shingles vaccine: 2012 Labs: PCP maintains all labs and urine.    reports that she quit smoking about 6 years ago. Her smoking use included Cigarettes. She has a 20 pack-year smoking history. She has never used smokeless tobacco. She reports that she drinks about 4.2 ounces of alcohol per week. She reports that she does not use illicit drugs.  Past Medical History  Diagnosis Date  . 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 HTN (hypertension)   . Hyperlipidemia   . Uterine prolapse   . Atrophic vaginitis   . Anxiety     Past Surgical History  Procedure Laterality Date  . Tonsillectomy and adenoidectomy    . Tubal ligation    . Colonoscopy      Current Outpatient Prescriptions  Medication Sig Dispense Refill  . amLODipine-benazepril (LOTREL) 5-10 MG per capsule Take 1 capsule by mouth daily.      Marland Kitchen aspirin 81 MG tablet Take 81 mg by mouth daily.        . calcium-vitamin D (OSCAL) 250-125 MG-UNIT per tablet Take 2 tablets by mouth daily. Take calcium 600mg   with vit d 400 units daily      . ESTRACE VAGINAL 0.1 MG/GM vaginal cream       . pravastatin (PRAVACHOL) 40 MG tablet Take 40 mg by mouth daily.       No current facility-administered medications for this visit.    Family History  Problem Relation Age of Onset  . Coronary artery disease    . Colon cancer Mother   . Heart disease Father   . Rheum arthritis Sister     ROS:  Pertinent items are noted in HPI.  Otherwise, a  comprehensive ROS was negative.  Exam:   BP 132/74  Pulse 76  Resp 12  Ht 5\' 4"  (1.626 m)  Wt 155 lb 6.4 oz (70.489 kg)  BMI 26.66 kg/m2 Height: 5\' 4"  (162.6 cm)  Ht Readings from Last 3 Encounters:  02/14/13 5\' 4"  (1.626 m)  11/13/12 5\' 4"  (1.626 m)  08/02/12 5\' 4"  (1.626 m)    General appearance: alert, cooperative and appears stated age Head: Normocephalic, without obvious abnormality, atraumatic Neck: no adenopathy, supple, symmetrical, trachea midline and thyroid normal to inspection and palpation Lungs: clear to auscultation bilaterally Breasts: normal appearance, no masses or tenderness Heart: regular rate and rhythm Abdomen: soft, non-tender; no masses,  no organomegaly Extremities: extremities normal, atraumatic, no cyanosis or edema Skin: Skin color, texture, turgor normal. No rashes or lesions Lymph nodes: Cervical, supraclavicular, and axillary nodes normal. No abnormal inguinal nodes palpated Neurologic: Grossly normal   Pelvic: External genitalia:  no lesions              Urethra:  normal appearing urethra with no masses, tenderness or lesions              Bartholin's and Skene's: normal  Vagina: normal appearing vagina with normal color and discharge, no lesions or excoriations at pessary site.  Pessary was cleaned and replaced.              Cervix: anteverted              Pap taken: no Bimanual Exam:  Uterus:  normal size, contour, position, consistency, mobility, non-tender with prolapse              Adnexa: no mass, fullness, tenderness               Rectovaginal: Confirms               Anus:  normal sphincter tone, no lesions  A:  Well Woman with normal exam  Postmenopausal  UTE prolapse with use of 3 " dish pessary since 12/2009  History of HTN and hypercholesterolemia   P:   Pap smear as per guidelines   Mammogram due 7/15  Refill Estrace Vaginal cream 2 times week for a year.  Recheck here in 3 months for pessary  Counseled on  breast self exam, use and side effects of HRT, adequate intake of calcium and vitamin D, diet and exercise, Kegel's exercises return annually or prn  An After Visit Summary was printed and given to the patient.

## 2013-02-18 NOTE — Progress Notes (Signed)
Encounter reviewed by Dr. Treyton Slimp Silva.  

## 2013-05-14 ENCOUNTER — Ambulatory Visit (INDEPENDENT_AMBULATORY_CARE_PROVIDER_SITE_OTHER): Payer: Medicare Other | Admitting: Nurse Practitioner

## 2013-05-14 ENCOUNTER — Encounter: Payer: Self-pay | Admitting: Nurse Practitioner

## 2013-05-14 VITALS — BP 140/84 | HR 68 | Ht 64.0 in | Wt 156.0 lb

## 2013-05-14 DIAGNOSIS — N814 Uterovaginal prolapse, unspecified: Secondary | ICD-10-CM

## 2013-05-14 NOTE — Progress Notes (Signed)
Encounter reviewed by Dr. Olajuwon Fosdick Silva.  

## 2013-05-14 NOTE — Progress Notes (Signed)
72 y.o. Divorced White female [id number] here for pessary check.  Patient has been using following pessary style and size:  3" dish pessary..  She is not sexually active.  She describes the following issues with the pessary:  When pushing during a BM she sometimes feels the dish come down and has to push it back into place.  ROS: no side effects of hormonal medications, no vaginal bleeding, no dysuria, trouble voiding or hematuria  Exam:   BP 140/84  Pulse 68  Ht 5\' 4"  (1.626 m)  Wt 156 lb (70.761 kg)  BMI 26.76 kg/m2 General appearance: alert, cooperative and appears stated age Inguinal adenopathy: normal   Pelvic: External genitalia:  no lesions and well estrogenized              Urethra: normal appearing urethra with no masses, tenderness or lesions              Bartholins and Skenes: normal                 Vagina: normal appearing vagina with normal color and discharge, no lesions              Cervix: normal appearance Bimanual Exam:  Uterus:  uterus is normal size, shape, consistency and nontender                               Adnexa:    not indicated                               Anus:  defer exam  Pessary was removed without difficulty.  Pessary was cleansed.  Pessary was not replaced. Patient tolerated procedure well.    A:  Urge incontinence, Cystocele- symptomatic       History of UTE prolapse  Recent constipation  P:   Return to office in 3 months for recheck.        Refill on Estrace vaginal cream to use twice weekly  Will try OTC Colace prn for constipation    An After Visit Summary was printed and given to the patient.

## 2013-06-09 ENCOUNTER — Emergency Department (HOSPITAL_COMMUNITY): Payer: Medicare Other

## 2013-06-09 ENCOUNTER — Encounter (HOSPITAL_COMMUNITY): Payer: Self-pay | Admitting: Emergency Medicine

## 2013-06-09 ENCOUNTER — Emergency Department (HOSPITAL_COMMUNITY)
Admission: EM | Admit: 2013-06-09 | Discharge: 2013-06-09 | Disposition: A | Discharge status: Home / Self Care | Payer: Medicare Other | Attending: Emergency Medicine | Admitting: Emergency Medicine

## 2013-06-09 DIAGNOSIS — I10 Essential (primary) hypertension: Secondary | ICD-10-CM | POA: Insufficient documentation

## 2013-06-09 DIAGNOSIS — Z7982 Long term (current) use of aspirin: Secondary | ICD-10-CM | POA: Insufficient documentation

## 2013-06-09 DIAGNOSIS — Z8659 Personal history of other mental and behavioral disorders: Secondary | ICD-10-CM | POA: Insufficient documentation

## 2013-06-09 DIAGNOSIS — Z87891 Personal history of nicotine dependence: Secondary | ICD-10-CM | POA: Insufficient documentation

## 2013-06-09 DIAGNOSIS — E785 Hyperlipidemia, unspecified: Secondary | ICD-10-CM | POA: Insufficient documentation

## 2013-06-09 DIAGNOSIS — Z79899 Other long term (current) drug therapy: Secondary | ICD-10-CM | POA: Insufficient documentation

## 2013-06-09 DIAGNOSIS — R42 Dizziness and giddiness: Secondary | ICD-10-CM

## 2013-06-09 DIAGNOSIS — Z880 Allergy status to penicillin: Secondary | ICD-10-CM | POA: Insufficient documentation

## 2013-06-09 DIAGNOSIS — Z8742 Personal history of other diseases of the female genital tract: Secondary | ICD-10-CM | POA: Insufficient documentation

## 2013-06-09 LAB — CBC WITH DIFFERENTIAL/PLATELET
Basophils Absolute: 0 10*3/uL (ref 0.0–0.1)
Basophils Relative: 0 % (ref 0–1)
Eosinophils Absolute: 0.2 10*3/uL (ref 0.0–0.7)
Eosinophils Relative: 3 % (ref 0–5)
MCH: 31.2 pg (ref 26.0–34.0)
MCHC: 34 g/dL (ref 30.0–36.0)
MCV: 91.5 fL (ref 78.0–100.0)
Neutrophils Relative %: 67 % (ref 43–77)
Platelets: 214 10*3/uL (ref 150–400)
RDW: 12.8 % (ref 11.5–15.5)

## 2013-06-09 LAB — URINALYSIS, ROUTINE W REFLEX MICROSCOPIC
Hgb urine dipstick: NEGATIVE
Nitrite: NEGATIVE
Protein, ur: NEGATIVE mg/dL
Urobilinogen, UA: 0.2 mg/dL (ref 0.0–1.0)

## 2013-06-09 LAB — BASIC METABOLIC PANEL
Calcium: 9.7 mg/dL (ref 8.4–10.5)
GFR calc Af Amer: >90 mL/min (ref >90)
GFR calc non Af Amer: 83 mL/min — ABNORMAL LOW (ref >90)
Glucose, Bld: 109 mg/dL — ABNORMAL HIGH (ref 70–99)
Potassium: 3.8 mEq/L (ref 3.5–5.1)
Sodium: 140 mEq/L (ref 135–145)

## 2013-06-09 MED ORDER — MECLIZINE HCL 25 MG PO TABS
25.0000 mg | ORAL_TABLET | Freq: Once | ORAL | Status: AC
  Administered 2013-06-09: 25 mg via ORAL
  Filled 2013-06-09: qty 1

## 2013-06-09 MED ORDER — MECLIZINE HCL 12.5 MG PO TABS: 25.0000 mg | ORAL_TABLET | Freq: Three times a day (TID) | ORAL | Status: DC | PRN

## 2013-06-09 MED ORDER — SODIUM CHLORIDE 0.9 % IV BOLUS (SEPSIS)
1000.0000 mL | Freq: Once | INTRAVENOUS | Status: AC
  Administered 2013-06-09: 1000 mL via INTRAVENOUS

## 2013-06-09 MED ORDER — DIAZEPAM 5 MG PO TABS
5.0000 mg | ORAL_TABLET | Freq: Once | ORAL | Status: AC
  Administered 2013-06-09: 5 mg via ORAL
  Filled 2013-06-09: qty 1

## 2013-06-09 NOTE — ED Provider Notes (Signed)
Medical screening examination/treatment/procedure(s) were conducted as a shared visit with non-physician practitioner(s) and myself.  I personally evaluated the patient during the encounter.  EKG Interpretation    Date/Time:  Sunday June 09 2013 08:35:27 EST Ventricular Rate:  63 PR Interval:  151 QRS Duration: 101 QT Interval:  434 QTC Calculation: 444 R Axis:   -11 Text Interpretation:  Sinus rhythm since last tracing no significant change Confirmed by Elisavet Buehrer  MD, Beckam Abdulaziz ([id number]) on 06/09/2013 8:45:44 AM            Pt with dizziness, not true vertigo, but feeling "off balance".  Worsens with head movement and standing.  No nystagmus.  No neuro deficits.  MRI neg for stroke.  Better after meds.  Will d/c.  Rolan Bucco, MD 06/09/13 [phone]

## 2013-06-09 NOTE — ED Notes (Signed)
Bed: ZO10 Expected date:  Expected time:  Means of arrival:  Comments: EMS 72 yo F ,dizziness, rt ear fullness

## 2013-06-09 NOTE — ED Notes (Signed)
Per PTAR report: Pt woke up to use the restroom and felt dizzy.  Pt got back to bed and laid down for about 30 minutes and got up again.  Pt reported feeling dizzy.  Pt reports ambulating prior to PTAR arrival. Pt a/o x 4.  Pt reports a dry mouth. Pt denies pain.  Pt reports a fullness in her right ear.

## 2013-06-09 NOTE — ED Provider Notes (Signed)
CSN: [id number]     Arrival date & time 06/09/13  4098 History   First MD Initiated Contact with Patient 06/09/13 [phone]     Chief Complaint  Patient presents with  . Dizziness   (Consider location/radiation/quality/duration/timing/severity/associated sxs/prior Treatment) HPI Comments: Patient is a 72 year old female with a past medical history of hypertension and hyperlipidemia who presents with dizziness that started this morning when the patient woke up to use the bathroom. Patient reports getting out of bed and feeling "off balance," as she reports running into the door frame on her way to the bathroom and having to hold on to the wall and other objects to walk straight. She reports feeling lightheaded with a mild "room spinning" aspect. Patient has never felt like this previously and was nervous about her symptoms since she lives at home. No aggravating/allevaiting factors. Patient reports some initial right ear fullness which has since resolved.    Past Medical History  Diagnosis Date  . 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 Graciela Husbands feels this was a misdiagnosed problem  . Hyperlipidemia 08/2011  . Uterine prolapse   . Atrophic vaginitis   . Anxiety   . HTN (hypertension) 2010   Past Surgical History  Procedure Laterality Date  . Tonsillectomy and adenoidectomy  age 89  . Colonoscopy  01/25/12    polyp recheck 3-5 years  . Tubal ligation  30's  . Wisdom tooth extraction  mid 20's   Family History  Problem Relation Age of Onset  . Coronary artery disease    . Colon cancer Mother   . Hypertension Mother   . Heart disease Father   . Hypertension Father   . Rheum arthritis Sister    History  Substance Use Topics  . Smoking status: Former Smoker -- 0.50 packs/day for 40 years    Types: Cigarettes    Quit date: 06/21/2006  . Smokeless tobacco: Never Used  . Alcohol Use: 4.2 oz/week    7 Glasses of wine per week     Comment: 4 oz red wine a day before dinner   OB History    Grav Para Term Preterm Abortions TAB SAB Ect Mult Living   3 2   1  1   2      Review of Systems  Constitutional: Negative for fever, chills and fatigue.  HENT: Negative for trouble swallowing.   Eyes: Negative for visual disturbance.  Respiratory: Negative for shortness of breath.   Cardiovascular: Negative for chest pain and palpitations.  Gastrointestinal: Negative for nausea, vomiting, abdominal pain and diarrhea.  Genitourinary: Negative for dysuria and difficulty urinating.  Musculoskeletal: Negative for arthralgias and neck pain.  Skin: Negative for color change.  Neurological: Positive for dizziness and light-headedness. Negative for weakness.  Psychiatric/Behavioral: Negative for dysphoric mood.    Allergies  Penicillins  Home Medications   Current Outpatient Rx  Name  Route  Sig  Dispense  Refill  . amLODipine (NORVASC) 5 MG tablet   Oral   Take 1 tablet by mouth daily.         Marland Kitchen aspirin 81 MG tablet   Oral   Take 81 mg by mouth daily.           . calcium-vitamin D (OSCAL) 250-125 MG-UNIT per tablet   Oral   Take 2 tablets by mouth daily. Take calcium 600mg   with vit d 400 units daily         . estradiol (ESTRACE VAGINAL) 0.1 MG/GM  vaginal cream   Vaginal   Place 0.25 Applicatorfuls vaginally 2 (two) times a week.   42.5 g   3   . pravastatin (PRAVACHOL) 40 MG tablet   Oral   Take 40 mg by mouth daily.          BP 168/72  Pulse 84  Temp(Src) 97.6 F (36.4 C) (Oral)  Resp 12  SpO2 99% Physical Exam  Nursing note and vitals reviewed. Constitutional: She is oriented to person, place, and time. She appears well-developed and well-nourished. No distress.  HENT:  Head: Normocephalic and atraumatic.  Right Ear: External ear normal.  Left Ear: External ear normal.  Mouth/Throat: Oropharynx is clear and moist. No oropharyngeal exudate.  Bilateral TM intact and without erythema.   Eyes: Conjunctivae and EOM are normal. Pupils are equal, round,  and reactive to light. No scleral icterus.  Neck: Normal range of motion. Neck supple.  Cardiovascular: Normal rate and regular rhythm.  Exam reveals no gallop and no friction rub.   No murmur heard. Pulmonary/Chest: Effort normal and breath sounds normal. She has no wheezes. She has no rales. She exhibits no tenderness.  Abdominal: Soft. She exhibits no distension. There is no tenderness. There is no rebound and no guarding.  Musculoskeletal: Normal range of motion.  Neurological: She is alert and oriented to person, place, and time. No cranial nerve deficit. Coordination normal.  Extremity strength and sensation is equal and intact bilaterally. Speech is goal-oriented. Moves limbs without ataxia.   Skin: Skin is warm and dry.  Psychiatric: She has a normal mood and affect. Her behavior is normal.    ED Course  Procedures (including critical care time) Labs Review Labs Reviewed  BASIC METABOLIC PANEL - Abnormal; Notable for the following:    Glucose, Bld 109 (*)    GFR calc non Af Amer 83 (*)    All other components within normal limits  URINE CULTURE  CBC WITH DIFFERENTIAL  URINALYSIS, ROUTINE W REFLEX MICROSCOPIC   Imaging Review Ct Head Wo Contrast  06/09/2013   CLINICAL DATA:  Dizziness.  EXAM: CT HEAD WITHOUT CONTRAST  TECHNIQUE: Contiguous axial images were obtained from the base of the skull through the vertex without intravenous contrast.  COMPARISON:  None.  FINDINGS: No mass lesion. No midline shift. No acute hemorrhage or hematoma. No extra-axial fluid collections. No evidence of acute infarction. There is slight prominence of the occipital horn of the left lateral ventricle. This is most likely developmental. Brain parenchyma appears normal.  No osseous abnormality. Specifically, the internal auditory canals and the middle ear cavities appear normal. Cerebellum is normal.  IMPRESSION: Essentially normal exam.   Electronically Signed   By: Geanie Cooley M.D.   On: 06/09/2013  08:04   Mr Brain Wo Contrast  06/09/2013   CLINICAL DATA:  Dizziness.  Difficulty ambulating.  EXAM: MRI HEAD WITHOUT CONTRAST  TECHNIQUE: Multiplanar, multiecho pulse sequences of the brain and surrounding structures were obtained without intravenous contrast.  COMPARISON:  06/09/2013 head CT.  No comparison MR.  FINDINGS: No acute infarct.  No intracranial hemorrhage.  Mild to moderate white matter type changes most likely related to result of small vessel disease in this hypertensive hyperlipidemia patient.  Dilated occipital horn left lateral ventricle most likely reflects result of remote injury. There is however, slight altered signal intensity along the superior margin of this dilated occipital horn (best seen on series 6, image 2). Given this slightly atypical appearance, contrast enhanced imaging in all 3  planes may be considered.  Partial opacification inferior aspect of the left mastoid air cells. No obstructing lesion posterior superior nasopharynx causing eustachian tube dysfunction noted.  Mild mucosal thickening left maxillary sinus with minimal mucosal thickening ethmoid sinus air cells.  Mild cervical spondylotic changes C2-3 with mild spinal stenosis.  Slightly heterogeneous bone marrow of the clivus without discrete mass noted. Cervical medullary junction unremarkable. Partially empty sella incidentally noted. Pineal region and orbital structures unremarkable.  Left vertebral artery is diminutive in size. It is possible this represents a congenitally small vessel however, atherosclerotic type changes or dissection contributing to this appearance cannot be excluded. If further delineation is clinically desired, MR angiogram of the circle Willis and contrast-enhanced MR angiogram of the neck can be obtained for further delineation.  IMPRESSION: No acute infarct.  Mild to moderate white matter type changes most likely related to result of small vessel disease in this hypertensive hyperlipidemia  patient.  Dilated occipital horn left lateral ventricle most likely reflects result of remote injury. There is however, slight altered signal intensity along the superior margin of this dilated occipital horn. Given this slightly atypical appearance, contrast enhanced imaging in all 3 planes may be considered.  Left vertebral artery is diminutive in size. It is possible this represents a congenitally small vessel however, atherosclerotic type changes or dissection contributing to this appearance cannot be excluded. If further delineation is clinically desired, MR angiogram of the circle Willis and contrast-enhanced MR angiogram of the neck can be obtained for further delineation.  Please see above.   Electronically Signed   By: Bridgett Larsson M.D.   On: 06/09/2013 11:31    EKG Interpretation    Date/Time:  Sunday June 09 2013 08:35:27 EST Ventricular Rate:  63 PR Interval:  151 QRS Duration: 101 QT Interval:  434 QTC Calculation: 444 R Axis:   -11 Text Interpretation:  Sinus rhythm since last tracing no significant change Confirmed by BELFI  MD, MELANIE ([id number]) on 06/09/2013 8:45:44 AM            MDM   1. Vertigo     7:08 AM Patient will have labs, urinalysis, and CT head. Vitals stable and patient afebrile. Patient will have valium for vertigo.   10:06 AM Labs, urinalysis, and CT head unremarkable for acute changes. Patient feels unsteady with ambulation. Valium did not improve symptoms. Patient will have MRI to rule out infarct.  1:38 PM Patient's MRI unremarkable for infarct and other acute changes. No neuro deficits. Patient given IV fluids and meclizine and reports improvement of symptoms. Patient likely has vertigo. Patient will be discharged with Meclizine and instructions to follow up with her PCP. Patient will return with worsening or concerning symptoms.   Emilia Beck, PA-C 06/09/13 1339

## 2013-06-09 NOTE — ED Notes (Signed)
Patient transported to MRI 

## 2013-06-09 NOTE — ED Notes (Signed)
Patient transported to CT 

## 2013-06-10 LAB — URINE CULTURE

## 2013-08-13 ENCOUNTER — Ambulatory Visit: Payer: Medicare Other | Admitting: Nurse Practitioner

## 2013-08-15 ENCOUNTER — Encounter: Payer: Self-pay | Admitting: Nurse Practitioner

## 2013-08-15 ENCOUNTER — Ambulatory Visit (INDEPENDENT_AMBULATORY_CARE_PROVIDER_SITE_OTHER): Payer: Medicare Other | Admitting: Nurse Practitioner

## 2013-08-15 VITALS — BP 140/76 | HR 68 | Ht 64.0 in | Wt 155.0 lb

## 2013-08-15 DIAGNOSIS — N814 Uterovaginal prolapse, unspecified: Secondary | ICD-10-CM

## 2013-08-15 NOTE — Patient Instructions (Signed)
Recheck in 3 months.

## 2013-08-15 NOTE — Progress Notes (Deleted)
Subjective:     Patient ID: Shari Kelly, female   DOB: 04/18/1941, 73 y.o.   MRN: [id number]  HPI  [facility] thought to be secondary to vertigo. MRI an CT scan was normal. Review of Systems     Objective:   Physical Exam     Assessment:     ***    Plan:     ***

## 2013-08-15 NOTE — Progress Notes (Signed)
Subjective:   73 y.o. Divorced White female [id number] here for pessary check.  Patient has been using following pessary style and size:  3 " dish pessary.  She describes the following issues with the pessary: none.   Use of protective clothing such as Depends or pads no. Problems with protective clothing with rash no.  Constipation issues with use of pessary no.  She is not sexually active.     ROS:   no breast pain or new or enlarging lumps on self exam,  no abnormal bleeding, pelvic pain or discharge, no dysuria, trouble voiding or hematuria.  No dysuria, trouble voiding or hematuria. Compliant to use of vaginal cream Yes. [facility] for a severe episode of vertigo.  She called EMS as she was afraid it may be related to a CVA.  All test were normal.  General Exam:    BP 140/76  Pulse 68  Ht 5\' 4"  (1.626 m)  Wt 155 lb (70.308 kg)  BMI 26.59 kg/m2  General appearance: alert, cooperative and appears stated age   Pelvic: External genitalia:  no lesions   Before pessary was removed no prolapse over the pessary   In correct position yes              Urethra: normal appearing urethra with no masses, tenderness or lesions              Vagina: normal appearing vagina with normal color and discharge, no lesions.  There are No abrasions or ulcerations.               Cervix: normal appearance Cervical lesions were not found   Bimanual Exam:  Uterus:  not examined"uterus is normal size, shape, consistency and non tender"}                               Adnexa:    not indicated                             Pessary was removed without difficulty without using forceps.  Pessary was cleansed with Betadine.  Pessary was not replaced. Patient tolerated procedure well.    Assement :  SUI, Urge incontinence        UTE prolapse   Use of pessary continued   History of HTN, recent episode of vertigo   Plan:    Return to office in 3 months for recheck.             An After Visit Summary  was printed and given to the patient.

## 2013-08-19 ENCOUNTER — Other Ambulatory Visit: Payer: Self-pay | Admitting: Internal Medicine

## 2013-08-20 NOTE — Progress Notes (Signed)
Encounter reviewed by Dr. Taite Schoeppner Silva.  

## 2013-10-19 ENCOUNTER — Other Ambulatory Visit: Payer: Self-pay | Admitting: Internal Medicine

## 2013-11-12 ENCOUNTER — Encounter: Payer: Self-pay | Admitting: Nurse Practitioner

## 2013-11-12 ENCOUNTER — Ambulatory Visit (INDEPENDENT_AMBULATORY_CARE_PROVIDER_SITE_OTHER): Payer: Medicare Other | Admitting: Nurse Practitioner

## 2013-11-12 VITALS — BP 132/70 | HR 72 | Ht 64.0 in | Wt 150.0 lb

## 2013-11-12 DIAGNOSIS — N814 Uterovaginal prolapse, unspecified: Secondary | ICD-10-CM

## 2013-11-12 NOTE — Patient Instructions (Addendum)
Continue Estrace hormone cream twice weekly

## 2013-11-12 NOTE — Progress Notes (Signed)
Subjective:   73 y.o. Divorced [place] female [id number] here for pessary check.  Patient has been using following pessary style and size:  3" dish pessary.  She describes the following issues with the pessary:  none.   Use of protective clothing such as Depends or pads no. Problems with protective clothing with rash no.  Constipation issues with use of pessary no.  She is not sexually active.     ROS:   no breast pain or new or enlarging lumps on self exam,  no abnormal bleeding, pelvic pain or discharge,   no dysuria, trouble voiding or hematuria. Compliant to use of vaginal cream Yes.  She will be having several dental surgeries to replace old crowns in June.  Then in July will have bilateral cataracts  General Exam:    BP 132/70  Pulse 72  Ht 5\' 4"  (1.626 m)  Wt 150 lb (68.04 kg)  BMI 25.73 kg/m2  General appearance: alert, cooperative and appears stated age   Pelvic: External genitalia:  no lesions and well estrogenized   Before pessary was removed no prolapse over the pessary   In correct position yes              Urethra: normal appearing urethra with no masses, tenderness or lesions              Vagina: normal appearing vagina with normal color and discharge, no lesions.  There are No abrasions or ulcerations.               Cervix: normal appearance Cervical lesions were not found   Bimanual Exam:  Uterus:  not examined"uterus is normal size, shape, consistency and non tender"}                               Adnexa:    not indicated                             Pessary was removed without difficulty without using forceps.  Pessary was cleansed with Betadine.  Pessary was replaced. Patient tolerated procedure well.    Assessment :  SUI, Cystocele- symptomatic        UTE prolapse   Use of pessary continued   History of HTN, vertigo, DDD   Plan:    Return to office in 3 months for recheck.              An After Visit Summary was printed and given to the patient.

## 2013-11-17 NOTE — Progress Notes (Signed)
Encounter reviewed by Dr. Brook Silva.  

## 2013-11-19 ENCOUNTER — Telehealth: Payer: Self-pay | Admitting: Nurse Practitioner

## 2013-11-19 NOTE — Telephone Encounter (Signed)
Spoke with patient. Advised it is okay for her to combine 3 month pessary recheck with AEX on 02/25/14. Patient is agreeable and verbalizes understanding.  Lauro FranklinPatricia Rolen-Grubb, FNP, I made AEX on 02/25/14 a 60 minute long appointment to allow extra time for AEX and 3 month pessary check.  Routing to provider for final review. Patient agreeable to disposition. Will close encounter

## 2013-11-19 NOTE — Telephone Encounter (Addendum)
Pt wanting to know if she can be seen for her pessary check in August at her aex appointment on 02/25/14.

## 2013-11-21 ENCOUNTER — Other Ambulatory Visit: Payer: Self-pay | Admitting: *Deleted

## 2013-11-21 MED ORDER — AMLODIPINE BESYLATE 5 MG PO TABS: ORAL_TABLET | ORAL | Status: DC

## 2014-02-11 ENCOUNTER — Ambulatory Visit: Payer: Medicare Other | Admitting: Nurse Practitioner

## 2014-02-25 ENCOUNTER — Ambulatory Visit (INDEPENDENT_AMBULATORY_CARE_PROVIDER_SITE_OTHER): Payer: Medicare Other | Admitting: Nurse Practitioner

## 2014-02-25 ENCOUNTER — Encounter: Payer: Self-pay | Admitting: Nurse Practitioner

## 2014-02-25 VITALS — BP 120/76 | HR 68 | Ht 63.75 in | Wt 143.0 lb

## 2014-02-25 DIAGNOSIS — Z4689 Encounter for fitting and adjustment of other specified devices: Secondary | ICD-10-CM

## 2014-02-25 DIAGNOSIS — Z01419 Encounter for gynecological examination (general) (routine) without abnormal findings: Secondary | ICD-10-CM

## 2014-02-25 DIAGNOSIS — N814 Uterovaginal prolapse, unspecified: Secondary | ICD-10-CM

## 2014-02-25 MED ORDER — ESTRADIOL 0.1 MG/GM VA CREA: 1.0000 | TOPICAL_CREAM | VAGINAL | Status: DC

## 2014-02-25 NOTE — Progress Notes (Signed)
Patient ID: Shari Kelly, female   DOB: Nov 07, 1940, 73 y.o.   MRN: [id number] 73 y.o. [id number] Divorced Caucasian Fe here for annual exam.  Recently off statins secondary to memory issues which was OK 'd by PCP.  Maybe related - but nor sure.  She is doing well otherwise except for mid back DDD.   Patient's last menstrual period was 07/04/1992.          Sexually active: no  The current method of family planning is post menopausal status.  Exercising: yes gym, yoga and yard work Smoker: no   Health Maintenance:  Pap: 11/10/2009 negative  MMG: 01/22/13 normal scheduled Colonoscopy: 01/25/2012, repeat in 3 years BMD: 12/21/2010 T Score: Spine 4.8/ left femur neck 0.6/ total 1.7 /radius total 0.5  TDaP: 09/14/2009  Shingles vaccine: 2012  Labs: PCP maintains all labs and urine.     reports that she quit smoking about 7 years ago. Her smoking use included Cigarettes. She has a 20 pack-year smoking history. She has never used smokeless tobacco. She reports that she drinks about 4.2 ounces of alcohol per week. She reports that she does not use illicit drugs.  Past Medical History  Diagnosis Date  . 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 Graciela Husbands feels this was a misdiagnosed problem  . Hyperlipidemia 08/2011  . Uterine prolapse   . Atrophic vaginitis   . Anxiety   . HTN (hypertension) 2010    Past Surgical History  Procedure Laterality Date  . Tonsillectomy and adenoidectomy  age 72  . Colonoscopy  01/25/12    polyp recheck 3-5 years  . Tubal ligation  30's  . Wisdom tooth extraction  mid 20's    Current Outpatient Prescriptions  Medication Sig Dispense Refill  . amLODipine (NORVASC) 5 MG tablet TAKE 1 TABLET BY MOUTH EVERY DAY  30 tablet  3  . aspirin 81 MG tablet Take 81 mg by mouth daily.        . calcium-vitamin D (OSCAL) 250-125 MG-UNIT per tablet Take 2 tablets by mouth daily. Take calcium   with vit d 400 units daily      . estradiol (ESTRACE) 0.1 MG/GM vaginal cream  Place 1 Applicatorful vaginally every other day.  42.5 g  3   No current facility-administered medications for this visit.    Family History  Problem Relation Age of Onset  . Coronary artery disease    . Colon cancer Mother   . Hypertension Mother   . Heart disease Father   . Hypertension Father   . Rheum arthritis Sister     ROS:  Pertinent items are noted in HPI.  Otherwise, a comprehensive ROS was negative.  Exam:   BP 120/76  Pulse 68  Ht 5' 3.75" (1.619 m)  Wt 143 lb (64.864 kg)  BMI 24.75 kg/m2  LMP 07/04/1992 Height: 5' 3.75" (161.9 cm)  Ht Readings from Last 3 Encounters:  02/25/14 5' 3.75" (1.619 m)  11/12/13  (1.626 m)  08/15/13  (1.626 m)    General appearance: alert, cooperative and appears stated age Head: Normocephalic, without obvious abnormality, atraumatic Neck: no adenopathy, supple, symmetrical, trachea midline and thyroid normal to inspection and palpation Lungs: clear to auscultation bilaterally Breasts: normal appearance, no masses or tenderness Heart: regular rate and rhythm Abdomen: soft, non-tender; no masses,  no organomegaly Extremities: extremities normal, atraumatic, no cyanosis or edema Skin: Skin color, texture, turgor normal. No rashes or lesions Lymph nodes: Cervical, supraclavicular, and  axillary nodes normal. No abnormal inguinal nodes palpated Neurologic: Grossly normal   Pelvic: External genitalia:  no lesions              Urethra:  normal appearing urethra with no masses, tenderness or lesions              Bartholin's and Skene's: normal                 Vagina: Dish pessary is removed and cleaned.  Normal appearing vagina with normal color and discharge, no lesions or excoriations.  The pessary was reinserted.              Cervix: anteverted              Pap taken: No. Bimanual Exam:  Uterus:  normal size, contour, position, consistency, mobility, non-tender              Adnexa: no mass, fullness, tenderness                Rectovaginal: Confirms               Anus:  normal sphincter tone, no lesions  A:  Well Woman with normal exam   Postmenopausal  on HRT for a few years initially  Uterine prolapse with use of a 3" dish pessary since 12/2009    P:   Reviewed health and wellness pertinent to exam  Pap smear not taken today  Mammogram is due and is scheduled  Refill Estrace cream to use as directed with pessary  Counseled on breast self exam, mammography screening, use and side effects of HRT, adequate intake of calcium and vitamin D, diet and exercise, Kegel's exercises return annually or prn  An After Visit Summary was printed and given to the patient.

## 2014-02-25 NOTE — Patient Instructions (Signed)

## 2014-02-27 ENCOUNTER — Encounter: Payer: Self-pay | Admitting: Nurse Practitioner

## 2014-02-27 ENCOUNTER — Other Ambulatory Visit: Payer: Self-pay | Admitting: Nurse Practitioner

## 2014-02-27 MED ORDER — ESTROGENS, CONJUGATED 0.625 MG/GM VA CREA: TOPICAL_CREAM | VAGINAL | Status: DC

## 2014-03-02 NOTE — Progress Notes (Signed)
Encounter reviewed by Dr. Italia Wolfert Silva.  

## 2014-03-18 ENCOUNTER — Other Ambulatory Visit: Payer: Self-pay | Admitting: Internal Medicine

## 2014-05-05 ENCOUNTER — Encounter: Payer: Self-pay | Admitting: Nurse Practitioner

## 2014-05-22 ENCOUNTER — Ambulatory Visit (INDEPENDENT_AMBULATORY_CARE_PROVIDER_SITE_OTHER): Payer: Medicare Other | Admitting: Nurse Practitioner

## 2014-05-22 ENCOUNTER — Encounter: Payer: Self-pay | Admitting: Nurse Practitioner

## 2014-05-22 VITALS — BP 124/78 | HR 80 | Resp 18 | Ht 63.75 in | Wt 142.0 lb

## 2014-05-22 DIAGNOSIS — N8110 Cystocele, unspecified: Secondary | ICD-10-CM

## 2014-05-22 DIAGNOSIS — IMO0002 Reserved for concepts with insufficient information to code with codable children: Secondary | ICD-10-CM

## 2014-05-22 NOTE — Progress Notes (Signed)
Subjective:   73 y.o. Divorced White female [id number] here for pessary check.  Patient has been using following pessary style and size:  3" dish pessary.  She describes the following issues with the pessary: none.   Use of protective clothing such as Depends or pads No.. Problems with protective clothing with rash No..  Constipation issues with use of pessary No..  She is not sexually active.     ROS:   no breast pain or new or enlarging lumps on self exam,  no abnormal bleeding, pelvic pain or discharge,   no dysuria, trouble voiding or hematuria. Compliant to use of vaginal cream Yes.   General Exam:    BP 124/78 mmHg  Pulse 80  Resp 18  Ht 5' 3.75" (1.619 m)  Wt 142 lb (64.411 kg)  BMI 24.57 kg/m2  LMP 07/04/1992  General appearance: alert, cooperative and appears stated age   Pelvic: External genitalia:  no lesions   Before pessary was removed No. prolapse over the pessary   In correct position Yes.                Urethra: normal appearing urethra with no masses, tenderness or lesions              Vagina: normal appearing vagina with normal color and discharge, no lesions.  There are No abrasions or ulcerations.               Cervix: normal appearance Cervical lesions were not found   Bimanual Exam:  Uterus:  normal size, contour, position, consistency, mobility, non-tender                               Adnexa:    not indicated                             Pessary was removed without difficulty without using forceps.  Pessary was cleansed with Betadine.  Pessary was replaced. Patient tolerated procedure well.    Assessment :  SUI, Cystocele- symptomatic        ATE prolapse   Use of pessary continued   History of HTN    Plan:    Return to office in 3 months for recheck.         prn    An After Visit Summary was printed and given to the patient.

## 2014-05-25 NOTE — Progress Notes (Signed)
Encounter reviewed by Dr. Jayle Solarz Silva.  

## 2014-08-13 ENCOUNTER — Ambulatory Visit (INDEPENDENT_AMBULATORY_CARE_PROVIDER_SITE_OTHER): Payer: Medicare Other | Admitting: Nurse Practitioner

## 2014-08-13 ENCOUNTER — Encounter: Payer: Self-pay | Admitting: Nurse Practitioner

## 2014-08-13 VITALS — BP 136/70 | HR 72 | Ht 63.75 in | Wt 142.0 lb

## 2014-08-13 DIAGNOSIS — IMO0002 Reserved for concepts with insufficient information to code with codable children: Secondary | ICD-10-CM

## 2014-08-13 DIAGNOSIS — N8110 Cystocele, unspecified: Secondary | ICD-10-CM

## 2014-08-13 DIAGNOSIS — N393 Stress incontinence (female) (male): Secondary | ICD-10-CM

## 2014-08-13 NOTE — Patient Instructions (Signed)
Recheck in 3 months.

## 2014-08-13 NOTE — Progress Notes (Signed)
Subjective:   74 y.o. Divorced White female [id number] here for pessary check.  Patient has been using following pessary style and size: #3 dish pessary.  She describes the following issues with the pessary:  none.   Use of protective clothing such as Depends or pads No.. Problems with protective clothing with rash No..  Constipation issues with use of pessary No..  She is not sexually active.     ROS:   no breast pain or new or enlarging lumps on self exam,  no abnormal bleeding, pelvic pain or discharge,   no dysuria, trouble voiding or hematuria. Compliant to use of vaginal cream Yes.   General Exam:    BP 136/70 mmHg  Pulse 72  Ht 5' 3.75" (1.619 m)  Wt 142 lb (64.411 kg)  BMI 24.57 kg/m2  LMP 07/04/1992  General appearance: alert, cooperative and appears stated age   Pelvic: External genitalia:  no lesions   Before pessary was removed No. prolapse over the pessary   In correct position Yes.                Urethra: normal appearing urethra with no masses, tenderness or lesions              Vagina: normal appearing vagina with normal color and discharge, no lesions.  There are No abrasions or ulcerations.               Cervix: normal appearance Cervical lesions were not found   Bimanual Exam:  Uterus:  not examined                               Adnexa:    not indicated                             Pessary was removed without difficulty without using forceps.  Pessary was cleansed with Betadine.  Pessary was replaced. Patient tolerated procedure well.    Assessment :  SUI, Cystocele- symptomatic       UTE prolapse   Use of pessary continued   History of HTN   Plan:    Return to office in 3 months for recheck.          An After Visit Summary was printed and given to the patient.

## 2014-08-15 NOTE — Progress Notes (Signed)
Reviewed personally.  M. Suzanne Augustin Bun, MD.  

## 2014-10-14 ENCOUNTER — Other Ambulatory Visit: Payer: Self-pay | Admitting: Internal Medicine

## 2014-10-14 NOTE — Telephone Encounter (Signed)
That is correct Mindy - thanks. Please defer to PCP.

## 2014-10-14 NOTE — Telephone Encounter (Signed)
Should this be deferred to pcp as patient is prn follow up and has not been seen since in over two years? Please advise. Thanks, MI

## 2014-10-22 ENCOUNTER — Other Ambulatory Visit: Payer: Self-pay | Admitting: Internal Medicine

## 2014-10-27 NOTE — Telephone Encounter (Signed)
Patient should contact ordering provider for refills. Dr. Graciela HusbandsKlein has not seen this patient since 2014 and instructions were prn follow up.

## 2014-10-27 NOTE — Telephone Encounter (Signed)
Loralie Champagneose M Jacobs   Adasyn Mcadams L Alishea Beaudin, RN (You) and Duke SalviaSteven C Klein, MD  3 days ago   (9:23 AM)    May we refill this patient has not had ov since 2014 (Routing comment)

## 2014-11-12 ENCOUNTER — Ambulatory Visit: Payer: Medicare Other | Admitting: Nurse Practitioner

## 2014-11-13 ENCOUNTER — Ambulatory Visit (INDEPENDENT_AMBULATORY_CARE_PROVIDER_SITE_OTHER): Payer: Medicare Other | Admitting: Nurse Practitioner

## 2014-11-13 ENCOUNTER — Encounter: Payer: Self-pay | Admitting: Nurse Practitioner

## 2014-11-13 VITALS — BP 136/74 | HR 70 | Ht 63.75 in | Wt 141.6 lb

## 2014-11-13 DIAGNOSIS — N8110 Cystocele, unspecified: Secondary | ICD-10-CM

## 2014-11-13 DIAGNOSIS — IMO0002 Reserved for concepts with insufficient information to code with codable children: Secondary | ICD-10-CM

## 2014-11-13 NOTE — Progress Notes (Signed)
Subjective:   74 y.o. Divorced White female [id number] here for pessary check.  Patient has been using following pessary style and size: #3 dish pessary.  She describes the following issues with the pessary:  none.   Use of protective clothing such as Depends or pads No.. Problems with protective clothing with rash No..  Constipation issues with use of pessary No..  She is not sexually active.     ROS:   no breast pain or new or enlarging lumps on self exam,  no abnormal bleeding, pelvic pain or discharge,   no dysuria, trouble voiding or hematuria. Compliant to use of vaginal cream Yes.   General Exam:    BP 136/74 mmHg  Pulse 70  Ht 5' 3.75" (1.619 m)  Wt 141 lb 9.6 oz (64.229 kg)  BMI 24.50 kg/m2  LMP 07/04/1992  General appearance: alert, cooperative, appears stated age and no distress   Pelvic: External genitalia:  no lesions   Before pessary was removed No. prolapse over the pessary   In correct position Yes.                Urethra: normal appearing urethra with no masses, tenderness or lesions              Vagina: normal appearing vagina with normal color and discharge, no lesions.  There are No abrasions or ulcerations.               Cervix: normal appearance Cervical lesions were not found   Bimanual Exam:  Uterus:  not examined                               Adnexa:    not indicated                             Pessary was removed without difficulty without using forceps.  Pessary was cleansed with Betadine.  Pessary was replaced. Patient tolerated procedure well.    Assessment :  SUI, Cystocele- symptomatic       UTE Prolapse   Use of pessary continued   History of HTN  Plan:    Return to office in 3 months for recheck and AEX         continue with hormone cream as directed  An After Visit Summary was printed and given to the patient.

## 2014-11-13 NOTE — Patient Instructions (Signed)
Recheck in 3 months.

## 2014-11-14 NOTE — Progress Notes (Signed)
Encounter reviewed by Dr. Brook Silva.  

## 2014-11-26 ENCOUNTER — Other Ambulatory Visit: Payer: Self-pay | Admitting: Internal Medicine

## 2014-11-26 DIAGNOSIS — Z139 Encounter for screening, unspecified: Secondary | ICD-10-CM

## 2014-12-08 ENCOUNTER — Ambulatory Visit
Admission: RE | Admit: 2014-12-08 | Discharge: 2014-12-08 | Disposition: A | Discharge status: Home / Self Care | Payer: Medicare Other | Source: Ambulatory Visit | Attending: Internal Medicine | Admitting: Internal Medicine

## 2014-12-08 DIAGNOSIS — Z139 Encounter for screening, unspecified: Secondary | ICD-10-CM

## 2014-12-29 ENCOUNTER — Other Ambulatory Visit: Payer: Self-pay

## 2015-01-14 ENCOUNTER — Encounter: Payer: Self-pay | Admitting: Gastroenterology

## 2015-03-02 ENCOUNTER — Ambulatory Visit (INDEPENDENT_AMBULATORY_CARE_PROVIDER_SITE_OTHER): Payer: Medicare Other | Admitting: Nurse Practitioner

## 2015-03-02 ENCOUNTER — Encounter: Payer: Self-pay | Admitting: Nurse Practitioner

## 2015-03-02 VITALS — BP 140/80 | HR 76 | Ht 63.75 in | Wt 141.0 lb

## 2015-03-02 DIAGNOSIS — Z0000 Encounter for general adult medical examination without abnormal findings: Secondary | ICD-10-CM

## 2015-03-02 DIAGNOSIS — Z01419 Encounter for gynecological examination (general) (routine) without abnormal findings: Secondary | ICD-10-CM | POA: Diagnosis not present

## 2015-03-02 MED ORDER — NONFORMULARY OR COMPOUNDED ITEM: Status: DC

## 2015-03-02 NOTE — Progress Notes (Signed)
Patient ID: Shari Kelly, female   DOB: 20-Aug-1940, 74 y.o.   MRN: [id number] 74 y.o. [id number] Divorced  Caucasian Fe here for annual exam.  She has white coat HTN and has recorded BP at home for PCP.    Using vaginal estrogen cream. Pessary is doing well.  Patient's last menstrual period was 07/04/1992 (approximate).          Sexually active: No.  The current method of family planning is post menopausal status.    Exercising: Yes.    gym 3 times per week, chair yoga once per week Smoker:  no  Health Maintenance: Pap: 11/10/2009 negative  MMG:  03/19/14, Bi-Rads 1:  Negative scheduled for next months Colonoscopy: 01/25/2012, repeat in 3 years due to small polyp seen on insertion, but not withdrawal; has family history of colon cancer, will discuss this need with Dr. Valentina Lucks BMD: 12/21/2010 T Score: Spine 4.8/ left femur neck 0.6/ total 1.7 /radius total 0.5  TDaP: 09/14/2009  Shingles vaccine: 2012  Labs:  PCP   reports that she quit smoking about 8 years ago. Her smoking use included Cigarettes. She has a 20 pack-year smoking history. She has never used smokeless tobacco. She reports that she drinks about 4.2 oz of alcohol per week. She reports that she does not use illicit drugs.  Past Medical History  Diagnosis Date  . 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 Graciela Husbands feels this was a misdiagnosed problem  . Hyperlipidemia 08/2011  . Uterine prolapse   . Atrophic vaginitis   . Anxiety   . HTN (hypertension) 2010    Past Surgical History  Procedure Laterality Date  . Tonsillectomy and adenoidectomy  age 22  . Colonoscopy  01/25/12    polyp recheck 3-5 years  . Tubal ligation  30's  . Wisdom tooth extraction  mid 20's    Current Outpatient Prescriptions  Medication Sig Dispense Refill  . amLODipine (NORVASC) 5 MG tablet TAKE 1 TABLET BY MOUTH EVERY DAY 30 tablet 6  . aspirin 81 MG tablet Take 81 mg by mouth daily.      . calcium-vitamin D (OSCAL) 250-125 MG-UNIT per tablet  Take 2 tablets by mouth daily. Take calcium 600mg   with vit d 400 units daily    . NONFORMULARY OR COMPOUNDED ITEM Estradiol 0.01% put 1 gm intravaginally twice weekly. 1 click as directed 60 each 4   No current facility-administered medications for this visit.    Family History  Problem Relation Age of Onset  . Coronary artery disease    . Colon cancer Mother   . Hypertension Mother   . Heart disease Father   . Hypertension Father   . Rheum arthritis Sister     ROS:  Pertinent items are noted in HPI.  Otherwise, a comprehensive ROS was negative.  Exam:   BP 140/80 mmHg  Pulse 76  Ht 5' 3.75" (1.619 m)  Wt 141 lb (63.957 kg)  BMI 24.40 kg/m2  LMP 07/04/1992 (Approximate) Height: 5' 3.75" (161.9 cm) Ht Readings from Last 3 Encounters:  03/02/15 5' 3.75" (1.619 m)  11/13/14 5' 3.75" (1.619 m)  08/13/14 5' 3.75" (1.619 m)    General appearance: alert, cooperative and appears stated age Head: Normocephalic, without obvious abnormality, atraumatic Neck: no adenopathy, supple, symmetrical, trachea midline and thyroid normal to inspection and palpation Lungs: clear to auscultation bilaterally Breasts: normal appearance, no masses or tenderness Heart: regular rate and rhythm Abdomen: soft, non-tender; no masses,  no organomegaly  Extremities: extremities normal, atraumatic, no cyanosis or edema Skin: Skin color, texture, turgor normal. No rashes or lesions Lymph nodes: Cervical, supraclavicular, and axillary nodes normal. No abnormal inguinal nodes palpated Neurologic: Grossly normal   Pelvic: External genitalia:  no lesions              Urethra:  normal appearing urethra with no masses, tenderness or lesions              Bartholin's and Skene's: normal                 Vagina: normal appearing vagina with normal color and discharge, no lesions.  Pessary was removed and cleaned before re insertion after pap.              Cervix: anteverted              Pap taken: Yes.    Bimanual Exam:  Uterus:  normal size, contour, position, consistency, mobility, non-tender              Adnexa: no mass, fullness, tenderness               Rectovaginal: Confirms               Anus:  normal sphincter tone, no lesions  Chaperone present: yes  A:  Well Woman with normal exam  Postmenopausal on HRT for a few years initially Uterine prolapse with use of a 3" dish pessary since 12/2009 with use of vaginal estrogen cream.    P:   Reviewed health and wellness pertinent to exam  Pap smear as above  Mammogram is due 03/2015  Refill on vagina estrogen cream  Counseled with risk of DVT, CVA, cancer, etc.  Counseled on breast self exam, mammography screening, use and side effects of HRT, adequate intake of calcium and vitamin D, diet and exercise, Kegel's exercises return annually or prn  An After Visit Summary was printed and given to the patient.

## 2015-03-02 NOTE — Patient Instructions (Signed)

## 2015-03-04 LAB — IPS PAP SMEAR ONLY

## 2015-03-04 NOTE — Progress Notes (Signed)
Encounter reviewed by Dr. Brook Amundson C. Silva.  

## 2015-03-05 ENCOUNTER — Encounter: Payer: Self-pay | Admitting: Nurse Practitioner

## 2015-03-05 ENCOUNTER — Ambulatory Visit: Payer: Medicare Other | Admitting: Nurse Practitioner

## 2015-03-05 ENCOUNTER — Telehealth: Payer: Self-pay

## 2015-03-05 NOTE — Telephone Encounter (Signed)
Visit Follow-Up Question  Message [id number]   From  KATEE WENTLAND   To  Ria Comment, FNP   Sent  03/05/2015 8:53 AM     Patty, I'm sorry to be a pest, but I don't feel comfortable using the alternative medication. Would you please call into [facility] at [address] and [address] and request Estrace? I like that one better than the Premarin. I appreciate the time you took to help me, and again I'm sorry it took up so much of your valuable time.      Responsible Party    Pool - [facility] No one has taken responsibility for this message.     No actions have been taken on this message.

## 2015-03-05 NOTE — Telephone Encounter (Signed)
Telephone encounter created to discuss with covering provider.

## 2015-03-05 NOTE — Telephone Encounter (Addendum)
Routing to Verner Chol CNM for review and advise as Lauro Franklin, FNP is out of the office today. Patient is requesting refill of Estrace instead of Premarin cream.

## 2015-03-06 ENCOUNTER — Other Ambulatory Visit: Payer: Self-pay | Admitting: Nurse Practitioner

## 2015-03-06 MED ORDER — ESTROGENS, CONJUGATED 0.625 MG/GM VA CREA: TOPICAL_CREAM | VAGINAL | Status: DC

## 2015-03-06 NOTE — Telephone Encounter (Signed)
Please let pt. Know Ok that she does not want the other vaginal cream and her regular RX is sent for her to [facility].

## 2015-03-06 NOTE — Telephone Encounter (Signed)
Shari Franklin, FNP please review and advise. Patient is requesting Estrace cream. Okay to switch from Premarin to Estrace? Please advise directions if so.

## 2015-03-06 NOTE — Telephone Encounter (Signed)
Left message to call Kaitlyn at [phone]. 

## 2015-03-06 NOTE — Telephone Encounter (Signed)
New RX is sent in for her.

## 2015-03-06 NOTE — Telephone Encounter (Signed)
Patty see note

## 2015-03-06 NOTE — Telephone Encounter (Signed)
Returning call.

## 2015-03-07 ENCOUNTER — Other Ambulatory Visit: Payer: Self-pay | Admitting: Nurse Practitioner

## 2015-03-10 NOTE — Telephone Encounter (Signed)
Spoke with patient. Advised rx for Estrace has been sent to pharmacy on file. Patient is agreeable and verbalizes understanding. Patient states that Premarin was $310 and is hoping Estrace will be cheaper for her.  Routing to provider for final review. Patient agreeable to disposition. Will close encounter.

## 2015-03-10 NOTE — Telephone Encounter (Signed)
Medication refill request: estrace Last AEX:  03/02/15 with PG Next AEX: 03/19/16 with PG Last MMG (if hormonal medication request): 03/19/14 Bi-rads c 1 neg Refill authorized: please advise

## 2015-03-17 ENCOUNTER — Ambulatory Visit: Payer: Medicare Other | Admitting: Nurse Practitioner

## 2015-03-25 ENCOUNTER — Ambulatory Visit: Payer: Medicare Other | Admitting: Nurse Practitioner

## 2015-07-08 ENCOUNTER — Ambulatory Visit: Payer: Medicare Other | Admitting: Nurse Practitioner

## 2015-07-10 ENCOUNTER — Encounter: Payer: Self-pay | Admitting: Nurse Practitioner

## 2015-07-10 ENCOUNTER — Ambulatory Visit (INDEPENDENT_AMBULATORY_CARE_PROVIDER_SITE_OTHER): Payer: Medicare Other | Admitting: Nurse Practitioner

## 2015-07-10 VITALS — BP 134/76 | HR 80 | Ht 63.75 in | Wt 144.0 lb

## 2015-07-10 DIAGNOSIS — N8110 Cystocele, unspecified: Secondary | ICD-10-CM

## 2015-07-10 DIAGNOSIS — IMO0002 Reserved for concepts with insufficient information to code with codable children: Secondary | ICD-10-CM

## 2015-07-10 NOTE — Progress Notes (Signed)
Subjective:   75 y.o. Divorced White female [id number] here for pessary check.  Patient has been using following pessary style and size:  # 3 dish pessary with support.  She describes the following issues with the pessary:  None other than not returning at her 3 month interval.  She failed to get an apt at her AEX for her 3 month follow up and basically forgot.  She has been using the estrogen cream and in between using Trimosan OTC.   Use of protective clothing such as Depends or pads No.. Problems with protective clothing with rash No..  Constipation issues with use of pessary No..  She is not sexually active.     ROS:   no breast pain or new or enlarging lumps on self exam,  no abnormal bleeding, pelvic pain or discharge,   no dysuria, trouble voiding or hematuria. Compliant to use of vaginal cream Yes.   General Exam:    BP 134/76 mmHg  Pulse 80  Ht 5' 3.75" (1.619 m)  Wt 144 lb (65.318 kg)  BMI 24.92 kg/m2  LMP 07/04/1992 (Approximate)  General appearance: alert, cooperative, appears stated age and no distress   Pelvic: External genitalia:  no lesions   Before pessary was removed No. prolapse over the pessary   In correct position Yes.                Urethra: normal appearing urethra with no masses, tenderness or lesions              Vagina: normal appearing vagina with normal color and discharge, no lesions.  There are No abrasions or ulcerations.               Cervix: normal appearance Cervical lesions were not found   Bimanual Exam:  Uterus:  not examined                               Adnexa:    not indicated                             Pessary was removed without difficulty without using forceps.  Pessary was cleansed with Betadine.  Pessary was replaced. Patient tolerated procedure well.    Assessment :  Normal exam, No contraindication to continuing hormonal contraception,   Cystocele- symptomatic, Rectocele- symptomatic       History of HTN   Use of pessary  continued   Plan:    Return to office in 3 months for recheck.         Continue with hormone cream as directed    An After Visit Summary was printed and given to the patient.

## 2015-07-11 NOTE — Progress Notes (Signed)
Encounter reviewed by Dr. Delta Pichon Amundson C. Silva.  

## 2015-10-14 ENCOUNTER — Encounter: Payer: Self-pay | Admitting: Nurse Practitioner

## 2015-10-14 ENCOUNTER — Ambulatory Visit (INDEPENDENT_AMBULATORY_CARE_PROVIDER_SITE_OTHER): Payer: Medicare Other | Admitting: Nurse Practitioner

## 2015-10-14 VITALS — BP 122/64 | HR 64 | Ht 63.75 in | Wt 139.0 lb

## 2015-10-14 DIAGNOSIS — IMO0002 Reserved for concepts with insufficient information to code with codable children: Secondary | ICD-10-CM

## 2015-10-14 DIAGNOSIS — N8110 Cystocele, unspecified: Secondary | ICD-10-CM | POA: Diagnosis not present

## 2015-10-14 DIAGNOSIS — Z4689 Encounter for fitting and adjustment of other specified devices: Secondary | ICD-10-CM

## 2015-10-14 DIAGNOSIS — N393 Stress incontinence (female) (male): Secondary | ICD-10-CM

## 2015-10-14 NOTE — Progress Notes (Signed)
Subjective:   75 y.o. Divorced White female [id number] here for pessary check.  Patient has been using following pessary style and size:  # 3 dish pessary with support.  She describes the following issues with the pessary:  none.   Use of protective clothing such as Depends or pads No.. Problems with protective clothing with rash No..  Constipation issues with use of pessary No..  She is not sexually active.     ROS:   no breast pain or new or enlarging lumps on self exam,  no abnormal bleeding, pelvic pain or discharge,   no dysuria, trouble voiding or hematuria. Compliant to use of vaginal cream Yes. But had decreased the amount of cream to help with cost of medication -still twice weekly.  General Exam:    BP 122/64 mmHg  Pulse 64  Ht 5' 3.75" (1.619 m)  Wt 139 lb (63.05 kg)  BMI 24.05 kg/m2  LMP 07/04/1992 (Approximate)  General appearance: alert, cooperative, appears stated age and no distress   Pelvic: External genitalia:  no lesions   Before pessary was removed No. prolapse over the pessary   In correct position Yes.                Urethra: normal appearing urethra with no masses, tenderness or lesions              Vagina: more atrophic appearing vagina than in the past with normal color and discharge, no lesions.  There are No abrasions or ulcerations.               Cervix: normal appearance Cervical lesions were not found   Bimanual Exam:  Uterus:  not examined                               Adnexa:    not indicated                             Pessary was removed without difficulty without using forceps.  Pessary was cleansed with Betadine.  Pessary was replaced. Patient tolerated procedure well.    Assessment :     Normal exam   No contraindication to continuing hormonal contraception   Urge incontinence      History of HTN   Use of pessary continued   Plan:    Return to office in 3 months for recheck.         she will go back to using 1/2 gm vaginal estrogen twice a  week    An After Visit Summary was printed and given to the patient.

## 2015-10-14 NOTE — Patient Instructions (Signed)
Go back to using Estrace cream 1/2 gm twice a week.

## 2015-10-16 NOTE — Progress Notes (Signed)
Reviewed personally.  M. Suzanne Bleu Minerd, MD.  

## 2016-01-13 ENCOUNTER — Encounter: Payer: Self-pay | Admitting: Nurse Practitioner

## 2016-01-13 ENCOUNTER — Ambulatory Visit (INDEPENDENT_AMBULATORY_CARE_PROVIDER_SITE_OTHER): Payer: Medicare Other | Admitting: Nurse Practitioner

## 2016-01-13 VITALS — BP 120/80 | HR 88 | Resp 16 | Ht 63.75 in | Wt 137.6 lb

## 2016-01-13 DIAGNOSIS — N393 Stress incontinence (female) (male): Secondary | ICD-10-CM | POA: Diagnosis not present

## 2016-01-13 DIAGNOSIS — Z4689 Encounter for fitting and adjustment of other specified devices: Secondary | ICD-10-CM

## 2016-01-13 MED ORDER — ESTROGENS, CONJUGATED 0.625 MG/GM VA CREA: TOPICAL_CREAM | VAGINAL | Status: DC

## 2016-01-13 MED ORDER — ESTRADIOL 0.1 MG/GM VA CREA: TOPICAL_CREAM | VAGINAL | Status: DC

## 2016-01-13 NOTE — Progress Notes (Signed)
Subjective:   75 y.o. Divorced White female [id number] here for pessary check.  Patient has been using following pessary style and size: # 3 dish with support .  She describes the following issues with the pessary:  none.   Use of protective clothing such as Depends or pads No.. Problems with protective clothing with rash No..  Constipation issues with use of pessary No..  She is not sexually active.     ROS:   no breast pain or new or enlarging lumps on self exam,  no abnormal bleeding, pelvic pain or discharge,   no dysuria, trouble voiding or hematuria. Compliant to use of vaginal cream Yes.   General Exam:    BP 120/80 mmHg  Pulse 88  Resp 16  Ht 5' 3.75" (1.619 m)  Wt 137 lb 9.6 oz (62.415 kg)  BMI 23.81 kg/m2  LMP 07/04/1992 (Approximate)  General appearance: alert, cooperative and appears stated age   Pelvic: External genitalia:  no lesions   Before pessary was removed No. prolapse over the pessary   In correct position Yes.                Urethra: normal appearing urethra with no masses, tenderness or lesions              Vagina: normal appearing vagina with normal color and discharge, no lesions.  There are No abrasions or ulcerations.               Cervix: normal appearance Cervical lesions were not found   Bimanual Exam:  Uterus:  not examined                               Adnexa:    not indicated                             Pessary was removed without difficulty without using forceps.  Pessary was cleansed with Betadine.  Pessary was replaced. Patient tolerated procedure well.    Assessment :  Normal exam   No contraindication to continuing hormonal contraception   Urge incontinence      History of HTN   Use of pessary continued   Plan:    Return to office in 3 months for recheck.         she may try to alter the Premarin vaginal cream and Trimosan to use every other time.  She is given RX for both Estrace and Premarin to see which is now cheaper for her.  She is  aware only 1 product can be used.  An After Visit Summary was printed and given to the patient.

## 2016-01-13 NOTE — Patient Instructions (Signed)
Estrace vaginal Premarin vaginal

## 2016-01-15 NOTE — Progress Notes (Signed)
Reviewed personally.  M. Suzanne Eldar Robitaille, MD.  

## 2016-03-08 ENCOUNTER — Encounter: Payer: Self-pay | Admitting: Nurse Practitioner

## 2016-03-09 ENCOUNTER — Encounter: Payer: Self-pay | Admitting: Nurse Practitioner

## 2016-03-09 ENCOUNTER — Ambulatory Visit (INDEPENDENT_AMBULATORY_CARE_PROVIDER_SITE_OTHER): Payer: Medicare Other | Admitting: Nurse Practitioner

## 2016-03-09 VITALS — BP 154/86 | HR 72 | Ht 63.5 in | Wt 136.0 lb

## 2016-03-09 DIAGNOSIS — Z4689 Encounter for fitting and adjustment of other specified devices: Secondary | ICD-10-CM

## 2016-03-09 DIAGNOSIS — Z01419 Encounter for gynecological examination (general) (routine) without abnormal findings: Secondary | ICD-10-CM

## 2016-03-09 DIAGNOSIS — Z0000 Encounter for general adult medical examination without abnormal findings: Secondary | ICD-10-CM | POA: Diagnosis not present

## 2016-03-09 NOTE — Progress Notes (Signed)
Patient ID: Shari GreenhouseMartha F Kelly, female   DOB: [birth year], 75 y.o.   MRN: [id number]  75 y.o. [id number] Divorced  Caucasian Fe here for annual exam. She did a price check on both Vagifem and Premarin vaginal cream.  The Vagifem cost was $260.59 and cream was $45.00. She request a new RX for cream.  No new health problems.   Patient's last menstrual period was 07/04/1992 (approximate).          Sexually active: No.  The current method of family planning is abstinence.    Exercising: Yes.    yoga, walking and yard work Smoker:  no  Health Maintenance: Pap: 03/03/15, Negative  MMG: 04/01/15, Bi-Rads 1: Negative, will schedule for 04/2016 Colonoscopy: 01/25/2012, repeat in 3 years due to small polyp seen on insertion, but not withdrawal;  will discuss this need with Dr. Valentina LucksGriffin BMD: 2017, pt thinks she may have had this done at Dr. Jone BasemanGriffin's office; previous 12/21/2010         T Score: Spine 4.8/ left femur neck 0.6/ total 1.7 /radius total 0.5  TDaP: 09/14/2009  Shingles vaccine: 2012  Pneumonia: never Hep C and HIV: Not indicated due to age Labs: Dr. Kirby FunkJohn Griffin takes care of all labs   reports that she quit smoking about 9 years ago. Her smoking use included Cigarettes. She has a 20.00 pack-year smoking history. She has never used smokeless tobacco. She reports that she drinks about 4.2 oz of alcohol per week . She reports that she does not use drugs.  Past Medical History:  Diagnosis Date  . Anxiety   . Atrophic vaginitis   . HTN (hypertension) 2010  . Hyperlipidemia 08/2011  . Mitral valve disorders 04/07/2009   now Dr. Graciela HusbandsKlein feels this was a misdiagnosed problem  . Uterine prolapse 09/2009   use of pessary dish 3 "    Past Surgical History:  Procedure Laterality Date  . COLONOSCOPY  01/25/12   polyp recheck 3-5 years  . TONSILLECTOMY AND ADENOIDECTOMY  age 89  . TUBAL LIGATION  30's  . WISDOM TOOTH EXTRACTION  mid 20's    Current Outpatient Prescriptions  Medication Sig Dispense  Refill  . amLODipine (NORVASC) 5 MG tablet TAKE 1 TABLET BY MOUTH EVERY DAY 30 tablet 6  . aspirin 81 MG tablet Take 81 mg by mouth daily.      Marland Kitchen. conjugated estrogens (PREMARIN) vaginal cream Use 1/2 g vaginally 2-3 times a wek 42.5 g 3   No current facility-administered medications for this visit.     Family History  Problem Relation Age of Onset  . Coronary artery disease    . Colon cancer Mother   . Hypertension Mother   . Heart disease Father   . Hypertension Father   . Rheum arthritis Sister     ROS:  Pertinent items are noted in HPI.  Otherwise, a comprehensive ROS was negative.  Exam:   BP (!) 154/86 (BP Location: Right Arm, Patient Position: Sitting, Cuff Size: Normal)   Pulse 72   Ht 5' 3.5" (1.613 m) Comment: measured  Wt 136 lb (61.7 kg)   LMP 07/04/1992 (Approximate)   BMI 23.71 kg/m  Height: 5' 3.5" (161.3 cm) (measured) Ht Readings from Last 3 Encounters:  03/09/16 5' 3.5" (1.613 m)  01/13/16 5' 3.75" (1.619 m)  10/14/15 5' 3.75" (1.619 m)    General appearance: alert, cooperative and appears stated age Head: Normocephalic, without obvious abnormality, atraumatic Neck: no adenopathy, supple, symmetrical, trachea midline and  thyroid normal to inspection and palpation Lungs: clear to auscultation bilaterally Breasts: normal appearance, no masses or tenderness Heart: regular rate and rhythm Abdomen: soft, non-tender; no masses,  no organomegaly Extremities: extremities normal, atraumatic, no cyanosis or edema Skin: Skin color, texture, turgor normal. No rashes or lesions Lymph nodes: Cervical, supraclavicular, and axillary nodes normal. No abnormal inguinal nodes palpated Neurologic: Grossly normal   Pelvic: External genitalia:  no lesions              Urethra:  normal appearing urethra with no masses, tenderness or lesions              Bartholin's and Skene's: normal                 Vagina: normal appearing vagina with normal color and discharge, no  lesions.  Dish pessary was removed and no erosions or problems.  The pessary was cleaned and reinserted.              Cervix: anteverted              Pap taken: No. Bimanual Exam:  Uterus:  normal size, contour, position, consistency, mobility, non-tender              Adnexa: no mass, fullness, tenderness               Rectovaginal: Confirms               Anus:  normal sphincter tone, no lesions  Chaperone present: yes   A:  Well Woman with normal exam            Postmenopausal on HRT for a few years initially Uterine prolapse with use of a 3" dish pessary since 12/2009 with use of vaginal estrogen cream.   P:   Reviewed health and wellness pertinent to exam  Pap smear as above  Mammogram is scheduled for 04/2016  Refill on Premarin cream for a year  Counseled with risk of DVT, CVA, cancer, etc.  Counseled on breast self exam, mammography screening, use and side effects of HRT, adequate intake of calcium and vitamin D, diet and exercise, Kegel's exercises return annually or prn  An After Visit Summary was printed and given to the patient.

## 2016-03-09 NOTE — Patient Instructions (Addendum)

## 2016-03-12 NOTE — Progress Notes (Signed)
Encounter reviewed by Dr. Lemar Bakos Amundson C. Silva.  

## 2016-08-05 ENCOUNTER — Encounter: Payer: Self-pay | Admitting: Nurse Practitioner

## 2016-08-05 ENCOUNTER — Ambulatory Visit (INDEPENDENT_AMBULATORY_CARE_PROVIDER_SITE_OTHER): Payer: Medicare Other | Admitting: Nurse Practitioner

## 2016-08-05 VITALS — BP 140/80 | HR 80 | Ht 63.5 in | Wt 140.0 lb

## 2016-08-05 DIAGNOSIS — N393 Stress incontinence (female) (male): Secondary | ICD-10-CM

## 2016-08-05 DIAGNOSIS — Z4689 Encounter for fitting and adjustment of other specified devices: Secondary | ICD-10-CM

## 2016-08-05 DIAGNOSIS — I10 Essential (primary) hypertension: Secondary | ICD-10-CM

## 2016-08-05 DIAGNOSIS — F329 Major depressive disorder, single episode, unspecified: Secondary | ICD-10-CM

## 2016-08-05 NOTE — Progress Notes (Signed)
Scheduled patient while in office to see Dr.John Valentina LucksGriffin on 08/09/2016 at 10 am. Patient is agreeable to date and time.

## 2016-08-05 NOTE — Patient Instructions (Signed)
Recheck in 3 month

## 2016-08-05 NOTE — Progress Notes (Signed)
Subjective:   76 y.o. Divorced White female [id number] here for pessary check.  Patient has been using following pessary style and size:  # 3 dish with support  She describes the following issues with the pessary:  none.   Use of protective clothing such as Depends or pads No.. Problems with protective clothing with rash No..  Constipation issues with use of pessary No.  She is not sexually active.     Patient is not herself today.  She is confused about some words when trying to make a conversation.  She has an elevated BP today 160/86 & 156/82 and 140/80.  After asking her several questions she can recall major events but has a problem with some details.  She also admits to having depression and not doing usual activities at home - like crossword puzzles.  The is coming up of 4 th anniversary of son's death.  ROS:   no breast pain or new or enlarging lumps on self exam,  no abnormal bleeding, pelvic pain or discharge,   no dysuria, trouble voiding or hematuria. Compliant to use of vaginal cream Yes.   General Exam:    BP 140/80   Pulse 80   Ht 5' 3.5" (1.613 m)   Wt 140 lb (63.5 kg)   LMP 07/04/1992 (Approximate)   BMI 24.41 kg/m   General appearance: alert and cooperative but she is confused with simple words like "battery" for her BP machine. Admits to depression, feeling lonely.  Lack of outside interactions.  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     Pelvic: External genitalia:  no lesions   Before pessary was removed No. prolapse over the pessary   In correct position Yes.                Urethra: not indicated              Vagina: normal appearing vagina with normal color and discharge, no lesions.  There are No abrasions or ulcerations.               Cervix: normal appearance Cervical lesions were not found   Bimanual Exam:  Uterus:  not examined                               Adnexa:    not indicated                              Pessary was removed without difficulty without using forceps.  Pessary was cleansed with Betadine.  Pessary was replaced. Patient tolerated procedure well.    Assessment :  Normal exam    No contraindication to continuing therapy with vaginal cream    Urge incontinence, Rectocele- asymptomatic       History of HTN    Recent problems with depression and loneliness    Confusion ? Related to outside stimulus or depression    Use of pessary continued   Plan:    Return to office in 3 months for recheck.         Will get an apt on Tuesday 08/09/16 with Dr. Denton LankGriffith to review her BP and depression symptoms and memory issues.    An After Visit Summary was printed and given to the patient.

## 2016-08-05 NOTE — Progress Notes (Signed)
Encounter reviewed by Dr. Niajah Sipos Amundson C. Silva.  

## 2016-08-09 ENCOUNTER — Other Ambulatory Visit: Payer: Self-pay | Admitting: Internal Medicine

## 2016-08-09 DIAGNOSIS — R413 Other amnesia: Secondary | ICD-10-CM

## 2016-08-10 ENCOUNTER — Other Ambulatory Visit: Payer: Self-pay | Admitting: Internal Medicine

## 2016-08-10 DIAGNOSIS — R413 Other amnesia: Secondary | ICD-10-CM

## 2016-08-17 ENCOUNTER — Ambulatory Visit
Admission: RE | Admit: 2016-08-17 | Discharge: 2016-08-17 | Disposition: A | Discharge status: Home / Self Care | Payer: Medicare Other | Source: Ambulatory Visit | Attending: Internal Medicine | Admitting: Internal Medicine

## 2016-08-17 ENCOUNTER — Inpatient Hospital Stay
Admission: RE | Admit: 2016-08-17 | Discharge: 2016-08-17 | Disposition: A | Discharge status: Home / Self Care | Payer: Medicare Other | Source: Ambulatory Visit | Attending: Internal Medicine | Admitting: Internal Medicine

## 2016-08-17 ENCOUNTER — Other Ambulatory Visit: Payer: Medicare Other

## 2016-08-17 DIAGNOSIS — R413 Other amnesia: Secondary | ICD-10-CM

## 2016-11-09 ENCOUNTER — Ambulatory Visit (INDEPENDENT_AMBULATORY_CARE_PROVIDER_SITE_OTHER): Payer: Medicare Other | Admitting: Nurse Practitioner

## 2016-11-09 ENCOUNTER — Encounter: Payer: Self-pay | Admitting: Nurse Practitioner

## 2016-11-09 VITALS — BP 118/80 | HR 72 | Resp 16 | Ht 63.5 in | Wt 138.0 lb

## 2016-11-09 DIAGNOSIS — Z4689 Encounter for fitting and adjustment of other specified devices: Secondary | ICD-10-CM | POA: Diagnosis not present

## 2016-11-09 NOTE — Patient Instructions (Signed)
Recheck in 3 months.

## 2016-11-09 NOTE — Progress Notes (Signed)
Subjective:   76 y.o. Divorced White female [id number] here for pessary check.  Patient has been using following pessary style and size:  # 3 dish pessary with support.  She describes the following issues with the pessary:  None.  She saw PCP 3 months ago from PCP got RX 1/2 tablet ? Med and she initially had fatigue and lethargy even on 1/2 tablet.  The a few months later was told to come off since no help.  Since being off med's has done well and knows that she has her ups and downs.  Bilateral Cataract were removed in April.   Use of protective clothing such as Depends or pads No.. Problems with protective clothing with rash No..  Constipation issues with use of pessary No..  She is not sexually active.     ROS:   no breast pain or new or enlarging lumps on self exam,  no abnormal bleeding, pelvic pain or discharge,   no dysuria, trouble voiding or hematuria. Compliant to use of vaginal cream Yes.   General Exam:    LMP 07/04/1992 (Approximate)   General appearance: alert, cooperative, appears stated age and no distress   Pelvic: External genitalia:  no lesions   Before pessary was removed No. prolapse over the pessary   In correct position Yes.                Urethra: normal appearing urethra with no masses, tenderness or lesions              Vagina: normal appearing vagina with normal color and discharge, no lesions.  There are No abrasions or ulcerations.               Cervix: normal appearance Cervical lesions were not found   Bimanual Exam:  Uterus:  not examined"uterus is normal size, shape, consistency and non tender"}                               Adnexa:    not indicated                             Pessary was removed without difficulty without using forceps.  Pessary was cleansed with Betadine.  Pessary was replaced. Patient tolerated procedure well.    Assessment :  No contraindication to continuing hormonal contraception, SUI, Urge incontinence, Cystocele- symptomatic  History of HTN - good today and was evaluated by PCP 3 months ago   Use of pessary continued   History of depression but did not do well on antidepressant medication X 2 months ?? Med - had more lethargy   Plan:    Return to office in 3 months for recheck.             An After Visit Summary was printed and given to the patient.

## 2016-11-10 ENCOUNTER — Encounter: Payer: Self-pay | Admitting: Nurse Practitioner

## 2016-11-10 NOTE — Progress Notes (Signed)
Encounter reviewed by Dr. Brook Amundson C. Silva.  

## 2017-01-10 ENCOUNTER — Telehealth: Payer: Self-pay | Admitting: Obstetrics & Gynecology

## 2017-01-10 NOTE — Telephone Encounter (Signed)
Left message regarding upcoming  "3 month recheck "appointment and aex has been canceled and needs to be rescheduled.

## 2017-02-15 ENCOUNTER — Ambulatory Visit: Payer: Medicare Other | Admitting: Nurse Practitioner

## 2017-02-21 ENCOUNTER — Encounter: Payer: Self-pay | Admitting: Certified Nurse Midwife

## 2017-02-21 ENCOUNTER — Ambulatory Visit (INDEPENDENT_AMBULATORY_CARE_PROVIDER_SITE_OTHER): Payer: Medicare Other | Admitting: Certified Nurse Midwife

## 2017-02-21 VITALS — BP 122/70 | HR 68 | Resp 16 | Ht 63.5 in | Wt 137.0 lb

## 2017-02-21 DIAGNOSIS — Z4689 Encounter for fitting and adjustment of other specified devices: Secondary | ICD-10-CM | POA: Diagnosis not present

## 2017-02-21 DIAGNOSIS — N952 Postmenopausal atrophic vaginitis: Secondary | ICD-10-CM | POA: Diagnosis not present

## 2017-02-21 DIAGNOSIS — N8110 Cystocele, unspecified: Secondary | ICD-10-CM | POA: Diagnosis not present

## 2017-02-21 MED ORDER — ESTROGENS, CONJUGATED 0.625 MG/GM VA CREA: TOPICAL_CREAM | VAGINAL | 0 refills | Status: DC

## 2017-02-21 NOTE — Progress Notes (Signed)
Subjective:   76 y.o. Divorced White female [id number] here for pessary check.  Patient has been using following pessary style and size:  # 3 dish pessary with support..  She describes the following issues with the pessary:  None. " I can't even feel it". Would like to stop using the Trimosan lubrican due to cost. Will continue Premarin use. Needs Rx until aex.   Use of protective clothing such as Depends or pads No.. Problems with protective clothing with rash No..  Constipation issues with use of pessary No..  She is not sexually active.     ROS:    no abnormal bleeding, pelvic pain or discharge, no dysuria, trouble voiding or hematuria   no dysuria, trouble voiding or hematuria. Compliant to use of vaginal cream Yes.   General Exam:    BP 122/70   Pulse 68   Resp 16   Ht 5' 3.5" (1.613 m)   Wt 137 lb (62.1 kg)   LMP 07/04/1992 (Approximate)   BMI 23.89 kg/m   General appearance: alert, cooperative, appears stated age and no distress   Pelvic: External genitalia:  no lesions and atrophic appearance   Before pessary was removed No. prolapse over the pessary   In correct position Yes.                Urethra: normal appearing urethra with no masses, tenderness or lesions              Vagina: normal appearing vagina with normal color and discharge, no lesions.  There are No abrasions or ulcerations.               Cervix: normal appearance and no CMT Cervical lesions were not found   Bimanual Exam:  Uterus:  normal size, contour, position, consistency, mobility, non-tender                                Adnexa:    normal adnexa in size, nontender and no masses                             Pessary was removed without difficulty without using forceps.  Pessary was cleansed with Betadine.  Pessary was replaced. Patient tolerated procedure well.    Assessment :  Normal pelvic exam       Grade 2 Cystocele without uterine prolapse   Use of pessary continued   Atrophic vaginitis with Premarin  cream use   Plan:  Return to office in 3 months for recheck. Reviewed normal pelvic findings and discussed warning signs and need to advise if occurs. Discussed using Premarin cream twice weekly and coconut oil trial as needed for moisture. Questions addressed. Will advise of status when she comes in for her aex. 03/16/17 Rx Premarin cream see order printed Rx given for price checking          Rv prn, as above    An After Visit Summary was printed and given to the patient.

## 2017-02-21 NOTE — Patient Instructions (Signed)
Use Premarin cream twice weekly in vagina and can use coconut oil as needed vaginally

## 2017-03-13 ENCOUNTER — Ambulatory Visit: Payer: Medicare Other | Admitting: Nurse Practitioner

## 2017-03-15 NOTE — Progress Notes (Signed)
76 y.o. [id number] Divorced  Caucasian Fe here for annual exam. Menopausal no HRT. Atrophic vaginitis with Premarin vaginal cream use with Pessary for cystocele support working well. Denies any vaginal bleeding or problems with pessary. Was in on 02/21/17 for pessary check all normal. Patient does not manage. Sees PCP for aex, hypertension management, labs. All stable per patient. Patient has noted some memory changes of familiar things, no problems driving, or paying bills, just finishing sentences at times. Staying social and active with exercise. Discussed concerns with Dr. Valentina LucksGriffin PCP and MRI was done. See in Epic, no identifiable cause of memory issue, age changes noted. Patient aware. No other health issues today.  Patient's last menstrual period was 07/04/1992 (approximate).          Sexually active: No.  The current method of family planning is post menopausal status.    Exercising: Yes.    walking & yoga Smoker:  no  Health Maintenance: Pap:  03-03-15 neg History of Abnormal Pap: no MMG:  04-20-16 category b density birads 1:neg Self Breast exams: no Colonoscopy:  2013 5 yrs? To check with Dr Valentina LucksGriffin and schedule BMD:   2017 records requested TDaP:  2011 Shingles: 2012 Pneumonia: not done Hep C and HIV: not done Labs: pcp   reports that she quit smoking about 10 years ago. Her smoking use included Cigarettes. She has a 20.00 pack-year smoking history. She has never used smokeless tobacco. She reports that she drinks about 4.2 oz of alcohol per week . She reports that she does not use drugs.  Past Medical History:  Diagnosis Date  . Anxiety   . Atrophic vaginitis   . HTN (hypertension) 2010  . Hyperlipidemia 08/2011  . Mitral valve disorders(424.0) 04/07/2009   now Dr. Graciela HusbandsKlein feels this was a misdiagnosed problem  . Uterine prolapse 09/2009   use of pessary dish 3 "    Past Surgical History:  Procedure Laterality Date  . CATARACT EXTRACTION, BILATERAL     2018  . COLONOSCOPY   01/25/12   polyp recheck 3-5 years  . SKIN BIOPSY     brown spot, neg per patient  . TONSILLECTOMY AND ADENOIDECTOMY  age 89  . TUBAL LIGATION  30's  . WISDOM TOOTH EXTRACTION  mid 20's    Current Outpatient Prescriptions  Medication Sig Dispense Refill  . amLODipine (NORVASC) 5 MG tablet TAKE 1 TABLET BY MOUTH EVERY DAY 30 tablet 6  . aspirin 81 MG tablet Take 81 mg by mouth daily.      Marland Kitchen. conjugated estrogens (PREMARIN) vaginal cream Use 1/2 g vaginally 2 times weekly with pessary use 42.5 g 0   No current facility-administered medications for this visit.     Family History  Problem Relation Age of Onset  . Hypertension Mother   . Diverticulitis Mother   . Heart disease Father   . Hypertension Father   . Rheum arthritis Sister   . Diverticulitis Sister   . Coronary artery disease Unknown     ROS:  Pertinent items are noted in HPI.  Otherwise, a comprehensive ROS was negative.  Exam:   BP 118/70   Pulse 68   Resp 16   Ht 5' 3.75" (1.619 m)   Wt 137 lb (62.1 kg)   LMP 07/04/1992 (Approximate)   BMI 23.70 kg/m  Height: 5' 3.75" (161.9 cm) Ht Readings from Last 3 Encounters:  03/16/17 5' 3.75" (1.619 m)  02/21/17 5' 3.5" (1.613 m)  11/09/16 5' 3.5" (1.613 m)  General appearance: alert, cooperative and appears stated age Head: Normocephalic, without obvious abnormality, atraumatic Neck: no adenopathy, supple, symmetrical, trachea midline and thyroid normal to inspection and palpation Lungs: clear to auscultation bilaterally Breasts: normal appearance, no masses or tenderness, No nipple retraction or dimpling, No nipple discharge or bleeding, No axillary or supraclavicular adenopathy Heart: regular rate and rhythm Abdomen: soft, non-tender; no masses,  no organomegaly Extremities: extremities normal, atraumatic, no cyanosis or edema Skin: Skin color, texture, turgor normal. No rashes or lesions Lymph nodes: Cervical, supraclavicular, and axillary nodes normal. No  abnormal inguinal nodes palpated Neurologic: Grossly normal   Pelvic: External genitalia:  no lesions, atrophic appearance              Urethra:  normal appearing urethra with no masses, tenderness or lesions              Bartholin's and Skene's: normal                 Vagina: Pessary noted in place, removed and vaginal inspection revealed normal appearing vagina with normal color and discharge, no lesions or ulcerations noted. Pessary cleaned and replaced.              Cervix: no cervical motion tenderness, no lesions and no ulcerations noted              Pap taken: No. Bimanual Exam:  Uterus:  normal size, contour, position, consistency, mobility, non-tender and anteverted              Adnexa: normal adnexa and no mass, fullness, tenderness               Rectovaginal: Confirms               Anus:  normal sphincter tone, no lesions  Chaperone present: yes  A:  Well Woman with normal exam  Menopausal no HRT  Cystocele grade 2 with slight uterine prolapse noted with #3 dish pessary with support, working well  Atrophic vaginitis using Premarin cream with good response  Hypertension management with PCP  Memory changes with MRI done recently see in epic, no obvious reason for memory change  P:   Reviewed health and wellness pertinent to exam  Aware of need to evaluate if vaginal bleeding  Discussed warning signs of pessary use and will need to come if problems. Recheck in 3 months  Continue Premarin use as instructed, has Rx and using coconut oil also  Continue follow up with PCP as indicated.  Discussed staying up to date with current events, read paper, do word finds or crossword puzzles to stimulate her brain. Encouraged to access [employer] for other helpful hints. Stay social with friends and family. Keep familiar information at easy access.  Questions addressed. Follow up with Dr. Valentina Lucks if changes.  Pap smear: no  counseled on breast self exam, mammography screening, adequate  intake of calcium and vitamin D, diet and exercise  return annually or prn  An After Visit Summary was printed and given to the patient.

## 2017-03-16 ENCOUNTER — Ambulatory Visit (INDEPENDENT_AMBULATORY_CARE_PROVIDER_SITE_OTHER): Payer: Medicare Other | Admitting: Certified Nurse Midwife

## 2017-03-16 ENCOUNTER — Encounter: Payer: Self-pay | Admitting: Certified Nurse Midwife

## 2017-03-16 VITALS — BP 118/70 | HR 68 | Resp 16 | Ht 63.75 in | Wt 137.0 lb

## 2017-03-16 DIAGNOSIS — N952 Postmenopausal atrophic vaginitis: Secondary | ICD-10-CM

## 2017-03-16 DIAGNOSIS — Z4689 Encounter for fitting and adjustment of other specified devices: Secondary | ICD-10-CM

## 2017-03-16 DIAGNOSIS — Z01419 Encounter for gynecological examination (general) (routine) without abnormal findings: Secondary | ICD-10-CM

## 2017-03-16 DIAGNOSIS — N814 Uterovaginal prolapse, unspecified: Secondary | ICD-10-CM

## 2017-03-16 DIAGNOSIS — R413 Other amnesia: Secondary | ICD-10-CM | POA: Diagnosis not present

## 2017-03-16 DIAGNOSIS — N951 Menopausal and female climacteric states: Secondary | ICD-10-CM

## 2017-03-16 NOTE — Patient Instructions (Signed)

## 2017-05-02 ENCOUNTER — Encounter: Payer: Self-pay | Admitting: Certified Nurse Midwife

## 2017-05-02 ENCOUNTER — Ambulatory Visit (INDEPENDENT_AMBULATORY_CARE_PROVIDER_SITE_OTHER): Payer: Medicare Other | Admitting: Certified Nurse Midwife

## 2017-05-02 VITALS — BP 120/80 | HR 72 | Resp 16 | Ht 63.75 in | Wt 136.0 lb

## 2017-05-02 DIAGNOSIS — N8110 Cystocele, unspecified: Secondary | ICD-10-CM | POA: Diagnosis not present

## 2017-05-02 DIAGNOSIS — Z4689 Encounter for fitting and adjustment of other specified devices: Secondary | ICD-10-CM

## 2017-05-02 NOTE — Progress Notes (Signed)
Subjective:   76 y.o. Divorced White female [id number] here for pessary re-insertion.   Patient has been using following pessary style and size:  # 3 dish pessary with support for cystocele support.  She describes the following issues with the pessary:  Inadvertent removal of pessary, collecting stool specimen for her annual stool check for PCP. She has no problems with use.   Use of protective clothing such as Depends or pads No.. Problems with protective clothing with rash No..  Constipation issues with use of pessary No..  She is not sexually active.     ROS:    no abnormal bleeding, pelvic pain or discharge, no dysuria, trouble voiding or hematuria   no dysuria, trouble voiding or hematuria. Compliant to use of vaginal cream Yes.   General Exam:    BP 120/80   Pulse 72   Resp 16   Ht 5' 3.75" (1.619 m)   Wt 136 lb (61.7 kg)   LMP 07/04/1992 (Approximate)   BMI 23.53 kg/m   General appearance: alert, cooperative, appears stated age, no distress and slight memory change but good recall   Pelvic: External genitalia:  no lesions and atrophic appearance                 Urethra: normal appearing urethra with no masses, tenderness or lesions              Vagina: normal appearing vagina with normal color and discharge, no lesions, atrophic.  There are No abrasions or ulcerations. Grade 2 cystocele noted               Cervix: normal appearance and no tenderness or bleeding noted Cervical lesions were not found   Bimanual Exam:  Uterus:  normal size, contour, position, consistency, mobility, non-tender                               Adnexa:    normal adnexa in size, nontender and no masses and no masses                             Pessary was removed by patient at home and brought in with her  Pessary was cleansed with Betadine.  Pessary was replaced. Patient tolerated procedure well.    Assessment :  Normal exam    Grade 2 cystocele without uterine prolapse with good support with pessary  use.    Use of pessary continued for support   Plan:       Discussed normal pelvic exam. Continue to use vaginal cream as recommended twice weekly. Discussed she had not injured herself with removal. Questions addressed.warning signs with pessary use reviewed.  Rv 3 months, prn  An After Visit Summary was printed and given to the patient.

## 2017-05-04 ENCOUNTER — Telehealth: Payer: Self-pay | Admitting: *Deleted

## 2017-05-04 NOTE — Telephone Encounter (Signed)
Call to patient. Patient notified of normal BMD results. Patient verbalized understanding.   Patient agreeable to disposition. Will close encounter.

## 2017-05-09 ENCOUNTER — Encounter: Payer: Self-pay | Admitting: Obstetrics & Gynecology

## 2017-06-23 ENCOUNTER — Ambulatory Visit: Payer: Medicare Other | Admitting: Certified Nurse Midwife

## 2017-06-28 ENCOUNTER — Ambulatory Visit: Payer: Medicare Other | Admitting: Certified Nurse Midwife

## 2017-06-29 ENCOUNTER — Other Ambulatory Visit: Payer: Self-pay

## 2017-06-29 ENCOUNTER — Encounter: Payer: Self-pay | Admitting: Certified Nurse Midwife

## 2017-06-29 ENCOUNTER — Ambulatory Visit: Payer: Medicare Other | Admitting: Certified Nurse Midwife

## 2017-06-29 VITALS — BP 120/80 | HR 68 | Resp 16 | Ht 63.75 in | Wt 133.0 lb

## 2017-06-29 DIAGNOSIS — Z4689 Encounter for fitting and adjustment of other specified devices: Secondary | ICD-10-CM | POA: Diagnosis not present

## 2017-06-29 DIAGNOSIS — N8110 Cystocele, unspecified: Secondary | ICD-10-CM | POA: Diagnosis not present

## 2017-06-29 NOTE — Patient Instructions (Addendum)
Needs to call if vaginal bleeding or difficulty emptying bladder or bowels. Return in 3 months for cleaning.

## 2017-06-29 NOTE — Progress Notes (Signed)
Subjective:   76 y.o. DivorcedWhite female [id number] here for pessary check.  Patient has been using following pessary style and size:  # 3 dish pessary with support of cystocele..  She describes the following issues with the pessary:  none.   Use of protective clothing such as Depends or pads No.. Problems with protective clothing with rash No..  Constipation issues with use of pessary No..  She is not sexually active.     ROS:   no vaginal bleeding, pelvic pain or discharge, no dysuria, trouble voiding or hematuria, or leaking . Compliant to use of vaginal cream Yes. Twice weekly  General Exam:    LMP 07/04/1992 (Approximate)   General appearance: alert, cooperative, appears stated age and no distress   Pelvic: External genitalia:  atrophic appearance, no lesions   Before pessary was removed No. prolapse over the pessary   In correct position Yes.                Urethra: normal appearing urethra with no masses, tenderness or lesions              Vagina: atrophic appearing vagina with normal color and discharge, no lesions.  There are No abrasions or ulcerations.               Cervix: normal appearance and non tender Cervical lesions were not found   Bimanual Exam:  Uterus:  normal size, contour, position, consistency, mobility, non-tender"uterus is normal size, shape, consistency and nontender"}                               Adnexa:    normal adnexa in size, nontender and no masses                             Pessary was removed without difficulty without using forceps.  Pessary was cleansed with Betadine.  Pessary was replaced. Patient tolerated procedure well.    Assessment :  Normal pessary surveillance        Normal pelvic exam   Cystocle grade 2 without uterine prolapse   Use of pessary continued for support of cystocele   Plan:    warning signs reviewed and need to advise if occurs. Continue to use cream twice weekly .  Rv 3 months, prn         An After Visit Summary was  printed and given to the patient.

## 2017-08-02 ENCOUNTER — Ambulatory Visit: Payer: Medicare Other | Admitting: Certified Nurse Midwife

## 2017-09-27 ENCOUNTER — Encounter: Payer: Self-pay | Admitting: Certified Nurse Midwife

## 2017-09-27 ENCOUNTER — Ambulatory Visit: Payer: Medicare Other | Admitting: Certified Nurse Midwife

## 2017-09-27 ENCOUNTER — Other Ambulatory Visit: Payer: Self-pay

## 2017-09-27 VITALS — BP 120/70 | HR 70 | Resp 16 | Ht 63.75 in | Wt 131.0 lb

## 2017-09-27 DIAGNOSIS — N814 Uterovaginal prolapse, unspecified: Secondary | ICD-10-CM

## 2017-09-27 DIAGNOSIS — Z4689 Encounter for fitting and adjustment of other specified devices: Secondary | ICD-10-CM

## 2017-09-27 NOTE — Patient Instructions (Addendum)
Call if vaginal bleeding or problems with pessary. Use Premarin cream vaginally 2 times weekly Return in 3 months. Have a great day Debbi

## 2017-09-27 NOTE — Progress Notes (Signed)
Subjective:   77 y.o. Divorced White female [id number] here for pessary check.  Patient has been using following pessary style and size:  # 3 dish pessary with support..  She describes the following issues with the pessary:  none.   Use of protective clothing such as Depends or pads No.. Problems with protective clothing with rash No..  Constipation issues with use of pessary No..  She is not sexually active.     ROS:   no breast pain or new or enlarging lumps on self exam, no vaginal bleeding, no discharge or pelvic pain, no dysuria, trouble voiding or hematuria. Compliant to use of vaginal cream Yes.   General Exam:    LMP 07/04/1992 (Approximate)   General appearance: alert, cooperative, appears stated age and no distress   Pelvic: External genitalia:  no lesions and atrophic appearance   Before pessary was removed No. prolapse over the pessary   In correct position Yes.                Urethra: normal appearing urethra with no masses, tenderness or lesions              Vagina: normal appearing vagina with normal color and discharge, no lesions.  There are No abrasions or ulcerations. Cystocele with uterine prolapse  noted               Cervix: normal appearance and no CMT or ulcerations noted Cervical lesions were not found   Bimanual Exam:  Uterus:  normal size, contour, position, consistency, mobility, non-tender and prolapsed second degree                               Adnexa:    normal adnexa in size, nontender and no masses                             Pessary was removed without difficulty without using forceps.  Pessary was cleansed with Betadine.  Pessary was replaced. Patient tolerated procedure well.    Assessment :  Normal pelvic exam with Cystocele with uterine prolapse grade 2 Normal pessary surveillance, patient managing well. Atrophic vaginitis with Premarin cream use, no problems with use  Plan:    Discussed findings with patient and continuation of pessary recommended  and she agrees. Warning signs with pessary use reviewed and printed for patient. Need to advise if occurs. Continue Premarin cream use twice weekly. Recheck in 3 months, prn   An After Visit Summary was printed and given to the patient.

## 2017-12-27 ENCOUNTER — Other Ambulatory Visit: Payer: Self-pay

## 2017-12-27 ENCOUNTER — Encounter: Payer: Self-pay | Admitting: Certified Nurse Midwife

## 2017-12-27 ENCOUNTER — Ambulatory Visit (INDEPENDENT_AMBULATORY_CARE_PROVIDER_SITE_OTHER): Payer: Medicare Other | Admitting: Certified Nurse Midwife

## 2017-12-27 VITALS — BP 140/80 | HR 80 | Resp 16 | Ht 63.75 in | Wt 124.0 lb

## 2017-12-27 DIAGNOSIS — N8110 Cystocele, unspecified: Secondary | ICD-10-CM

## 2017-12-27 DIAGNOSIS — Z4689 Encounter for fitting and adjustment of other specified devices: Secondary | ICD-10-CM

## 2017-12-27 NOTE — Progress Notes (Signed)
Subjective:   77 y.o. Divorced White female [id number] here for pessary check.  Patient has been using following pessary style and size:  #3 ring pessary.  She describes the following issues with the pessary: none. Using Premarin cream twice weekly and Coconut oil twice weekly for vaginal dryness with no issues. Denies vaginal bleeding or dryness, incontinence or problems with emptying bowels. Taking Aricept now for memory changes and trying to adjust to fatigue it causes. No other health issues today.   Use of protective clothing such as Depends or pads No.. Problems with protective clothing with rash No..  Constipation issues with use of pessary No..  She is not sexually active.     ROS:   no breast pain or new or enlarging lumps on self exam,  no abnormal bleeding, pelvic pain or discharge,   no dysuria, trouble voiding or hematuria. Compliant to use of vaginal cream Yes.   General Exam:    BP (!) 140/98   Pulse 72   Resp 16   Ht 5' 3.75" (1.619 m)   Wt 124 lb (56.2 kg)   LMP 07/04/1992 (Approximate)   BMI 21.45 kg/m   General appearance: alert, cooperative and appears stated age   Pelvic: External gennormal appearing vagina with normal color and discharge, no lesions, atrophicitalia:  no lesions and atrophic appearance   Before pessary was removed No. prolapse over the pessary   In correct position Yes.                Urethra: normal appearing urethra with no masses, tenderness or lesions              Vagina: .  There are No abrasions or ulcerations. Grade 2 cystocele noted.               Cervix: normal appearance and non tender, no  ulcerations noted Cervical lesions were not found   Bimanual Exam:  Uterus:  normal size, contour, position, consistency, mobility, non-tender and anteverted                                Adnexa:    normal adnexa in size, nontender and no masses                             Pessary was removed without difficulty without using forceps.  Pessary was  cleansed with Betadine.  Pessary was replaced. Patient tolerated procedure well.    Assessment :  Normal pelvic exam        Atrophic vaginitis with Premarin use working well   Grade 2 cystocele unchanged   Use of pessary continued   Plan:    Warning signs with pessary use given. Discussed talking with her PCP regarding her Aricept medication change, if continues to cause weakness. She may try taking earlier in evening to see if this helps.  Return to office in 3 months for recheck with aex.           An After Visit Summary was printed and given to the patient.

## 2017-12-27 NOTE — Patient Instructions (Signed)
Call if vaginal bleeding or problems with pessary 

## 2018-03-27 ENCOUNTER — Encounter: Payer: Self-pay | Admitting: Certified Nurse Midwife

## 2018-03-27 ENCOUNTER — Ambulatory Visit (INDEPENDENT_AMBULATORY_CARE_PROVIDER_SITE_OTHER): Payer: Medicare Other | Admitting: Certified Nurse Midwife

## 2018-03-27 ENCOUNTER — Other Ambulatory Visit: Payer: Self-pay

## 2018-03-27 ENCOUNTER — Other Ambulatory Visit (HOSPITAL_COMMUNITY)
Admission: RE | Admit: 2018-03-27 | Discharge: 2018-03-27 | Disposition: A | Discharge status: Home / Self Care | Payer: Medicare Other | Source: Ambulatory Visit | Attending: Certified Nurse Midwife | Admitting: Certified Nurse Midwife

## 2018-03-27 VITALS — BP 118/80 | HR 70 | Resp 16 | Ht 63.5 in | Wt 123.0 lb

## 2018-03-27 DIAGNOSIS — Z124 Encounter for screening for malignant neoplasm of cervix: Secondary | ICD-10-CM

## 2018-03-27 DIAGNOSIS — Z4689 Encounter for fitting and adjustment of other specified devices: Secondary | ICD-10-CM | POA: Diagnosis not present

## 2018-03-27 DIAGNOSIS — Z01419 Encounter for gynecological examination (general) (routine) without abnormal findings: Secondary | ICD-10-CM

## 2018-03-27 DIAGNOSIS — N8111 Cystocele, midline: Secondary | ICD-10-CM

## 2018-03-27 DIAGNOSIS — N951 Menopausal and female climacteric states: Secondary | ICD-10-CM

## 2018-03-27 DIAGNOSIS — N952 Postmenopausal atrophic vaginitis: Secondary | ICD-10-CM

## 2018-03-27 MED ORDER — ESTROGENS, CONJUGATED 0.625 MG/GM VA CREA: TOPICAL_CREAM | VAGINAL | 2 refills | Status: DC

## 2018-03-27 NOTE — Progress Notes (Signed)
77 y.o. [id number] Divorced  Caucasian Fe here for annual exam. Menopausal, denies vaginal bleeding or vaginal dryness. Sees PCP Dr.Klein twice yearly for medication management of Hypertension and Aricept. Patient feels her memory has not changed in the last year. Makes notes for herself now. Still driving and working in yard. Pessary working well with Premarin cream use for dryness. Uses cream and coconut oil twice weekly. Denies vaginal or urinary symptoms with use. No other health issues today.  Patient's last menstrual period was 07/04/1992 (approximate).          Sexually active: No.  The current method of family planning is post menopausal status.    Exercising: Yes.    walking Smoker:  no  Review of Systems  Constitutional: Negative.   HENT: Negative.   Eyes: Negative.   Respiratory: Negative.   Cardiovascular: Negative.   Gastrointestinal: Negative.   Genitourinary: Negative.   Musculoskeletal: Negative.   Skin: Negative.   Neurological: Negative.   Endo/Heme/Allergies: Negative.   Psychiatric/Behavioral: Negative.     Health Maintenance: Pap:  03-03-15 neg History of Abnormal Pap: no MMG:  04-27-17 category b density birads 2:neg Self Breast exams: no Colonoscopy:  2013 BMD:   2018 TDaP:  2011 Shingles: 2012 Pneumonia: not done Hep C and HIV: not done Labs: if needed   reports that she quit smoking about 11 years ago. Her smoking use included cigarettes. She has a 20.00 pack-year smoking history. She has never used smokeless tobacco. She reports that she drinks about 7.0 standard drinks of alcohol per week. She reports that she does not use drugs.  Past Medical History:  Diagnosis Date  . Anxiety   . Atrophic vaginitis   . HTN (hypertension) 2010  . Hyperlipidemia 08/2011  . Mitral valve disorders(424.0) 04/07/2009   now Dr. Graciela HusbandsKlein feels this was a misdiagnosed problem  . Uterine prolapse 09/2009   use of pessary dish 3 "    Past Surgical History:  Procedure  Laterality Date  . CATARACT EXTRACTION, BILATERAL     2018  . COLONOSCOPY  01/25/12   polyp recheck 3-5 years  . SKIN BIOPSY     brown spot, neg per patient  . TONSILLECTOMY AND ADENOIDECTOMY  age 89  . TUBAL LIGATION  30's  . WISDOM TOOTH EXTRACTION  mid 20's    Current Outpatient Medications  Medication Sig Dispense Refill  . amLODipine (NORVASC) 5 MG tablet TAKE 1 TABLET BY MOUTH EVERY DAY 30 tablet 6  . Calcium Citrate-Vitamin D (CALCIUM + D PO) Take by mouth.    . conjugated estrogens (PREMARIN) vaginal cream Use 1/2 g vaginally 2 times weekly with pessary use 42.5 g 0  . donepezil (ARICEPT) 10 MG tablet TK 1 T PO QD HS  3   No current facility-administered medications for this visit.     Family History  Problem Relation Age of Onset  . Hypertension Mother   . Diverticulitis Mother   . Heart disease Father   . Hypertension Father   . Rheum arthritis Sister   . Diverticulitis Sister   . Coronary artery disease Unknown     ROS:  Pertinent items are noted in HPI.  Otherwise, a comprehensive ROS was negative.  Exam:   LMP 07/04/1992 (Approximate)    Ht Readings from Last 3 Encounters:  12/27/17 5' 3.75" (1.619 m)  09/27/17 5' 3.75" (1.619 m)  06/29/17 5' 3.75" (1.619 m)    General appearance: alert, cooperative and appears stated age Head: Normocephalic, without  obvious abnormality, atraumatic Neck: no adenopathy, supple, symmetrical, trachea midline and thyroid normal to inspection and palpation Lungs: clear to auscultation bilaterally Breasts: normal appearance, no masses or tenderness, No nipple retraction or dimpling, No nipple discharge or bleeding, No axillary or supraclavicular adenopathy Heart: regular rate and rhythm Abdomen: soft, non-tender; no masses,  no organomegaly Extremities: extremities normal, atraumatic, no cyanosis or edema Skin: Skin color, texture, turgor normal. No rashes or lesions Lymph nodes: Cervical, supraclavicular, and axillary nodes  normal. No abnormal inguinal nodes palpated Neurologic: Grossly normal   Pelvic: External genitalia:  no lesions              Urethra:  normal appearing urethra with no masses, tenderness or lesions              Bartholin's and Skene's: normal                 Vagina:  Pessary noted in place, removed.normal appearing vagina with normal color and discharge, no lesions or ulcerations, cystocele grade 2              Cervix: no bleeding following Pap, no cervical motion tenderness, no lesions and no ulceration or abrasions with pessary use              Pap taken: Yes.   Bimanual Exam:  Uterus:  normal size, contour, position, consistency, mobility, non-tender and anteverted              Adnexa: normal adnexa and no mass, fullness, tenderness               Rectovaginal: Confirms               Anus:  normal sphincter tone, no lesions  Chaperone present: yes  A:  Well Woman with normal exam  Menopausal with Premarin cream use for atrophy and pessary use  Cystocele with pessary support working well  Normal pessary surveillance  Hypertension and aricept medication management with PCP, no memory change per patient  Mammogram due, patient to schedule, given information to call.  P:   Reviewed health and wellness pertinent to exam  Discussed continued use of pessary for cystocele support, patient request continuance. Pessary cleaned.  Pessary re-inserted with out difficulty . Patient sat, ambulated and stood and felt it was in good position.   Will need removal and cleaning again at 3 months and continue Premarin cream use twice weekly.  Rx Premarin cream see order with instructions  Continue follow up with PCP as indicated  Pap smear: yes   counseled on breast self exam, mammography screening, menopause, adequate intake of calcium and vitamin D, diet and exercise, Kegel's exercises  return annually or prn  An After Visit Summary was printed and given to the patient.

## 2018-03-27 NOTE — Patient Instructions (Signed)

## 2018-03-30 LAB — CYTOLOGY - PAP
Diagnosis: NEGATIVE
HPV: NOT DETECTED

## 2018-06-06 ENCOUNTER — Ambulatory Visit: Payer: Medicare Other | Admitting: Certified Nurse Midwife

## 2018-06-06 ENCOUNTER — Encounter: Payer: Self-pay | Admitting: Certified Nurse Midwife

## 2018-06-06 ENCOUNTER — Other Ambulatory Visit: Payer: Self-pay

## 2018-06-06 VITALS — BP 120/70 | HR 70 | Resp 16 | Wt 125.0 lb

## 2018-06-06 DIAGNOSIS — Z4689 Encounter for fitting and adjustment of other specified devices: Secondary | ICD-10-CM | POA: Diagnosis not present

## 2018-06-06 DIAGNOSIS — R413 Other amnesia: Secondary | ICD-10-CM | POA: Diagnosis not present

## 2018-06-06 DIAGNOSIS — N8110 Cystocele, unspecified: Secondary | ICD-10-CM

## 2018-06-06 NOTE — Progress Notes (Signed)
Subjective:   77 y.o. DivorcedWhite female [id number] here for pessary check.  Patient has been using following pessary style and size:  #3 ring with support..  She describes the following issues with the pessary:  Removed pessary and unable to reinsert. She had bowel movement and was cleaning and thought it was stool that needed to be removed, but removed pessary instead. She is due later this month for recheck of pessary so will do today. Denies any problems with use or emptying bowels. Memory unchanged and writes in a notebook to help with daily task. No other health issues today.She is not sexually active.     ROS:   no breast pain or new or enlarging lumps on self exam,  no abnormal bleeding, pelvic pain or discharge,   no dysuria, trouble voiding or hematuria. Compliant to use of vaginal cream Yes. Eating well, no changes in health.  General Exam:    BP 120/70   Pulse 70   Resp 16   Wt 125 lb (56.7 kg)   LMP 07/04/1992 (Approximate)   BMI 21.80 kg/m   General appearance: alert, cooperative, appears stated age and no distress   Pelvic: External genitalia:  no lesions and atrophic appearance                Urethra: normal appearance non tender              Vagina: normal appearing atrophic vagina with normal color and discharge, no lesions.  There are No abrasions or ulcerations. Cystocele grade 2 noted.               Cervix: normal appearance and no excoriations or ulcerations  Bimanual Exam:  Uterus:  normal size, contour, position, consistency, mobility, non-tender                              Adnexa:    normal adnexa in size, nontender and no masses Rectum: normal appearance                         Pessary was brought in by patient for insertion.  Pessary was cleansed with Betadine.  Pessary was replaced. Patient tolerated procedure well.    Assessment :  Normal pelvic exam       Grade 2 cystocele no change   Atrophic vaginitis uses Premarin/coconut oil working well   Use of pessary  continued   Plan:    Return to office in 3 months for recheck. Questions addressed. Warning signs with pessary use reviewed.       Rv 3 months, prn    An After Visit Summary was printed and given to the patient.

## 2018-07-03 ENCOUNTER — Ambulatory Visit: Payer: Medicare Other | Admitting: Certified Nurse Midwife

## 2018-09-05 ENCOUNTER — Encounter: Payer: Self-pay | Admitting: Certified Nurse Midwife

## 2018-09-05 ENCOUNTER — Ambulatory Visit: Payer: Medicare Other | Admitting: Certified Nurse Midwife

## 2018-09-05 VITALS — BP 118/66 | HR 64 | Resp 14 | Ht 63.25 in | Wt 122.0 lb

## 2018-09-05 DIAGNOSIS — Z4689 Encounter for fitting and adjustment of other specified devices: Secondary | ICD-10-CM

## 2018-09-05 NOTE — Patient Instructions (Signed)
Continue to use Premarin Cream 2 times weekly Call if vaginal bleeding or pain.

## 2018-09-05 NOTE — Progress Notes (Signed)
Subjective:   78 y.o. Divorced  White female [id number] here for pessary check.  Patient has been using following pessary style and size: # 3 ring.  She describes the following issues with the pessary: none. Patient has lost 3 pounds since 12/19. Has been eating more Snack packs and smaller meals now. Still driving. Drinks water daily, and varied beverages.  Use of protective clothing such as Depends or pads No.. Problems with protective clothing with rash No..  Constipation issues with use of pessary No..  She is not sexually active.     ROS:   no breast pain or new or enlarging lumps on self exam,  no abnormal bleeding, pelvic pain or discharge,   no dysuria, trouble voiding or hematuria. Compliant to use of vaginal cream Yes.   General Exam:    LMP 07/04/1992 (Approximate)   General appearance: alert, cooperative, appears stated age, no distress and some occasional memory concerns   Pelvic: External genitalia:  no lesions   Before pessary was removed No. prolapse over the pessary   In correct position Yes.                Urethra: normal appearance, non tender              Vagina: normal appearing vagina with normal color and discharge, no lesions.  There are No abrasions or ulcerations.               Cervix: normal appearance and non tender, no CMT Cervical lesions were not found   Bimanual Exam:  Uterus:  normal size, contour, position, consistency, mobility, non-tender and anteverted                              Adnexa:    normal adnexa in size, nontender and no masses                             Pessary was removed without difficulty without using forceps.  Pessary was cleansed with Betadine.  Pessary was replaced in vagina.. Patient tolerated procedure well.    Assessment :  Normal pelvic exam    Cystocele grade 2 no change   Atrophic vaginitis using Premarin cream as directed   Use of pessary continued   Plan:   Discussed importance of using Premarin cream twice weekly and coconut  oil if dryness noted.   Discussed no change in cystocele.   Warning signs reviewed with pessary use of bleeding or pain or odorous discharge or urinary issues. Questions addressed.    Rv 3 months An After Visit Summary was printed and given to the patient.

## 2018-12-06 ENCOUNTER — Ambulatory Visit: Payer: Medicare Other | Admitting: Certified Nurse Midwife

## 2018-12-10 NOTE — Progress Notes (Deleted)
ROS  78 y.o. {MARITAL STATUS:22092} {Race/ethnicity:17218} female [id number] here for follow up of ****** treated with ******* initiated on {DATE MONTH DAY NXGZ:[id number]}. Completed all medication as directed.  Denies any symptoms of**************************.    O: Healthy WD,WN female Affect:  Skin: Abdomen:{Exam; abdomen brief:12273} Pelvic exam:{pe pelvic exam prenatal obgyn:314539}  A:****** Resolved     P: Discussed findings of   Labs   Instructions given regarding:  RV

## 2018-12-11 ENCOUNTER — Ambulatory Visit: Payer: Medicare Other | Admitting: Certified Nurse Midwife

## 2018-12-11 ENCOUNTER — Telehealth: Payer: Self-pay | Admitting: Certified Nurse Midwife

## 2018-12-11 NOTE — Telephone Encounter (Signed)
Shari Kelly [facility] appointment this morning. Left voicemail to call back to reschedule.

## 2019-04-02 ENCOUNTER — Other Ambulatory Visit: Payer: Self-pay

## 2019-04-03 ENCOUNTER — Other Ambulatory Visit: Payer: Self-pay

## 2019-04-03 ENCOUNTER — Encounter: Payer: Self-pay | Admitting: Certified Nurse Midwife

## 2019-04-03 ENCOUNTER — Ambulatory Visit (INDEPENDENT_AMBULATORY_CARE_PROVIDER_SITE_OTHER): Payer: Medicare Other | Admitting: Certified Nurse Midwife

## 2019-04-03 VITALS — BP 122/80 | HR 68 | Temp 97.2°F | Resp 16 | Ht 63.0 in | Wt 109.0 lb

## 2019-04-03 DIAGNOSIS — N814 Uterovaginal prolapse, unspecified: Secondary | ICD-10-CM | POA: Diagnosis not present

## 2019-04-03 DIAGNOSIS — Z4689 Encounter for fitting and adjustment of other specified devices: Secondary | ICD-10-CM

## 2019-04-03 DIAGNOSIS — Z01419 Encounter for gynecological examination (general) (routine) without abnormal findings: Secondary | ICD-10-CM | POA: Diagnosis not present

## 2019-04-03 DIAGNOSIS — R413 Other amnesia: Secondary | ICD-10-CM | POA: Diagnosis not present

## 2019-04-03 DIAGNOSIS — N952 Postmenopausal atrophic vaginitis: Secondary | ICD-10-CM

## 2019-04-03 DIAGNOSIS — R634 Abnormal weight loss: Secondary | ICD-10-CM

## 2019-04-03 NOTE — Progress Notes (Signed)
78 y.o. [id number] Divorced  Caucasian Fe here for annual exam. Menopausal denies vaginal bleeding or vaginal dryness. Has a household aid Dione form [employer] to take her for groceries or trips, assist with care. Walks daily now. Patient eats 3 times daily per patient. Aware she had lost weight, but does not understand why. She is aware she has memory issues. Still has pessary in from last visit in 09/2018. Denies problems with. Has not been using Premarin as indicated and did not bring today. Patient repetitive with information today. No other health issues today.  Patient's last menstrual period was 07/04/1992 (approximate).          Sexually active: No.  The current method of family planning is post menopausal status.    Exercising: No.  walking Smoker:  no  Review of Systems  Constitutional: Negative.   HENT: Negative.   Eyes: Negative.   Respiratory: Negative.   Cardiovascular: Negative.   Gastrointestinal: Negative.   Genitourinary: Negative.   Musculoskeletal: Negative.   Skin: Negative.   Neurological: Negative.   Endo/Heme/Allergies: Negative.   Psychiatric/Behavioral: Negative.     Health Maintenance: Pap:  03-03-15 neg, 03-27-18 neg HPV HR neg History of Abnormal Pap: no MMG:  04-27-17 category b density birads 2:neg, unsure if one was done last yr Self Breast exams: no Colonoscopy:  2013  BMD:   2018 TDaP:  2011 Shingles: 2012 Pneumonia: unsure Hep C and HIV: unsure Labs: if needed   reports that she quit smoking about 12 years ago. Her smoking use included cigarettes. She has a 20.00 pack-year smoking history. She has never used smokeless tobacco. She reports current alcohol use of about 7.0 standard drinks of alcohol per week. She reports that she does not use drugs.  Past Medical History:  Diagnosis Date  . Anxiety   . Atrophic vaginitis   . HTN (hypertension) 2010  . Hyperlipidemia 08/2011  . Mitral valve disorders(424.0) 04/07/2009   now Dr. Graciela Husbands feels this  was a misdiagnosed problem  . Uterine prolapse 09/2009   use of pessary dish 3 "    Past Surgical History:  Procedure Laterality Date  . CATARACT EXTRACTION, BILATERAL     2018  . COLONOSCOPY  01/25/12   polyp recheck 3-5 years  . SKIN BIOPSY     brown spot, neg per patient  . TONSILLECTOMY AND ADENOIDECTOMY  age 51  . TUBAL LIGATION  30's  . WISDOM TOOTH EXTRACTION  mid 20's    Current Outpatient Medications  Medication Sig Dispense Refill  . amLODipine (NORVASC) 5 MG tablet TAKE 1 TABLET BY MOUTH EVERY DAY 30 tablet 6  . Calcium Citrate-Vitamin D (CALCIUM + D PO) Take by mouth.    . conjugated estrogens (PREMARIN) vaginal cream Use 1/2 g vaginally 2 times weekly with pessary use 42.5 g 2  . donepezil (ARICEPT) 10 MG tablet 5 mg.   3   No current facility-administered medications for this visit.     Family History  Problem Relation Age of Onset  . Hypertension Mother   . Diverticulitis Mother   . Heart disease Father   . Hypertension Father   . Rheum arthritis Sister   . Diverticulitis Sister   . Coronary artery disease Unknown     ROS:  Pertinent items are noted in HPI.  Otherwise, a comprehensive ROS was negative.  Exam:   LMP 07/04/1992 (Approximate)    Ht Readings from Last 3 Encounters:  09/05/18 5' 3.25" (1.607 m)  03/27/18 5'  3.5" (1.613 m)  12/27/17 5' 3.75" (1.619 m)    General appearance: alert, cooperative and appears stated age Head: Normocephalic, without obvious abnormality, atraumatic Neck: no adenopathy, supple, symmetrical, trachea midline and thyroid normal to inspection and palpation Lungs: clear to auscultation bilaterally Breasts: normal appearance, no masses or tenderness, No nipple retraction or dimpling, No nipple discharge or bleeding, No axillary or supraclavicular adenopathy Heart: regular rate and rhythm Abdomen: soft, non-tender; no masses,  no organomegaly Extremities: extremities normal, atraumatic, no cyanosis or edema Skin: Skin  color, texture, turgor normal. No rashes or lesions Lymph nodes: Cervical, supraclavicular, and axillary nodes normal. No abnormal inguinal nodes palpated Neurologic: Grossly normal   Pelvic: External genitalia:  no lesions              Urethra:  normal appearing urethra with no masses, tenderness or lesions              Bartholin's and Skene's: normal                 Vagina: normal appearing vagina with normal color and discharge, no lesions, incontinence dish pessary noted in place, removed. No ulcerations or abrasions noted. Pessary was cleaned and re-inserted in correct position.              Cervix: no cervical motion tenderness, no lesions and normal appearance, no ulcerations or abrasions noted on cervix              Pap taken: No. 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  Has home health aid now for meals and appointments  P:   Reviewed health and wellness pertinent to exam  Discussed need to advise if vaginal bleeding or urinary issues with pessary use. Pessary reinserted with out difficulty. Will need to return in 3 months for cleaning and be sure and bring Premarin cream to appointment.  Patient requested home health aid to listen to instructions so she would know what to do. Aide acknowledged information and also had scheduled an appointment for patient with PCP next month. Ask to share weight loss concerns.  Pap smear: no  Discussed importance of mammogram, SBE, eating  well with calcium and vitamin D. Regular exercise.  return annually or prn  An After Visit Summary was printed and given to the patient.

## 2019-04-03 NOTE — Patient Instructions (Signed)

## 2019-04-24 ENCOUNTER — Ambulatory Visit: Payer: Medicare Other | Admitting: Podiatry

## 2019-04-24 ENCOUNTER — Encounter: Payer: Self-pay | Admitting: Podiatry

## 2019-04-24 ENCOUNTER — Other Ambulatory Visit: Payer: Self-pay

## 2019-04-24 VITALS — BP 150/72 | HR 72

## 2019-04-24 DIAGNOSIS — B351 Tinea unguium: Secondary | ICD-10-CM

## 2019-04-24 DIAGNOSIS — M2012 Hallux valgus (acquired), left foot: Secondary | ICD-10-CM | POA: Diagnosis not present

## 2019-04-24 DIAGNOSIS — M2011 Hallux valgus (acquired), right foot: Secondary | ICD-10-CM

## 2019-04-24 DIAGNOSIS — M79675 Pain in left toe(s): Secondary | ICD-10-CM | POA: Diagnosis not present

## 2019-04-24 DIAGNOSIS — M79674 Pain in right toe(s): Secondary | ICD-10-CM

## 2019-04-24 NOTE — Patient Instructions (Signed)

## 2019-04-28 NOTE — Progress Notes (Signed)
Subjective: Shari Kelly presents today referred by Kirby Funk, MD for foot evaluation.  Patient denies any history of foot wounds.  Patient denies any history of numbness, tingling, burning, pins/needles sensations.  Today, patient c/o of painful, discolored, thick toenails which interfere with daily activities.  Pain is aggravated when wearing enclosed shoe gear.  She states she works out in her garden quite a bit and her feet are wet when she's done. She usually wears 2 pair of socks.  Past Medical History:  Diagnosis Date  . Anxiety   . Atrophic vaginitis   . HTN (hypertension) 2010  . Hyperlipidemia 08/2011  . Mitral valve disorders(424.0) 04/07/2009   now Dr. Graciela Husbands feels this was a misdiagnosed problem  . Uterine prolapse 09/2009   use of pessary dish 3 "    Patient Active Problem List   Diagnosis Date Noted  . Memory change 06/06/2018    Class: History of  . HTN (hypertension)   . MITRAL VALVE DISORDERS 04/07/2009    Past Surgical History:  Procedure Laterality Date  . CATARACT EXTRACTION, BILATERAL     2018  . COLONOSCOPY  01/25/12   polyp recheck 3-5 years  . SKIN BIOPSY     brown spot, neg per patient  . TONSILLECTOMY AND ADENOIDECTOMY  age 62  . TUBAL LIGATION  30's  . WISDOM TOOTH EXTRACTION  mid 20's    Current Outpatient Medications on File Prior to Visit  Medication Sig Dispense Refill  . amLODipine (NORVASC) 5 MG tablet TAKE 1 TABLET BY MOUTH EVERY DAY 30 tablet 6  . Calcium Citrate-Vitamin D (CALCIUM + D PO) Take by mouth.    . conjugated estrogens (PREMARIN) vaginal cream Use 1/2 g vaginally 2 times weekly with pessary use 42.5 g 2  . donepezil (ARICEPT) 10 MG tablet 5 mg.   3   No current facility-administered medications on file prior to visit.      Allergies  Allergen Reactions  . Penicillins     REACTION: rash,mouth ulcers    Social History   Occupational History  . Occupation: Retired    Comment: Lab work and Therapist, nutritional   Tobacco Use  . Smoking status: Former Smoker    Packs/day: 0.50    Years: 40.00    Pack years: 20.00    Types: Cigarettes    Quit date: 06/21/2006    Years since quitting: 12.8  . Smokeless tobacco: Never Used  Substance and Sexual Activity  . Alcohol use: Not Currently  . Drug use: No  . Sexual activity: Not Currently    Birth control/protection: Post-menopausal    Family History  Problem Relation Age of Onset  . Hypertension Mother   . Diverticulitis Mother   . Heart disease Father   . Hypertension Father   . Rheum arthritis Sister   . Diverticulitis Sister   . Coronary artery disease Unknown     Immunization History  Administered Date(s) Administered  . Influenza, High Dose Seasonal PF 04/04/2018  . Influenza-Unspecified 03/24/2013  . Tdap 07/04/2009    Review of systems: Positive Findings in bold print.  Constitutional:  chills, fatigue, fever, sweats, weight change Communication: Nurse, learning disability, sign Presenter, broadcasting, hand writing, iPad/Android device Head: headaches, head injury Eyes: changes in vision, eye pain, glaucoma, cataracts, macular degeneration, diplopia, glare,  light sensitivity, eyeglasses or contacts, blindness Ears nose mouth throat: hearing impaired, hearing aids,  ringing in ears, deaf, sign language,  vertigo, nosebleeds,  rhinitis,  cold sores, snoring, swollen glands  Cardiovascular: HTN, edema, arrhythmia, pacemaker in place, defibrillator in place, chest pain/tightness, chronic anticoagulation, blood clot, heart failure, MI Peripheral Vascular: leg cramps, varicose veins, blood clots, lymphedema, varicosities Respiratory:  difficulty breathing, denies congestion, SOB, wheezing, cough, emphysema Gastrointestinal: change in appetite or weight, abdominal pain, constipation, diarrhea, nausea, vomiting, vomiting blood, change in bowel habits, abdominal pain, jaundice, rectal bleeding, hemorrhoids, GERD Genitourinary:  nocturia,  pain on urination,  polyuria,  blood in urine, Foley catheter, urinary urgency, ESRD on hemodialysis Musculoskeletal: amputation, cramping, stiff joints, painful joints, decreased joint motion, fractures, OA, gout, hemiplegia, paraplegia, uses cane, wheelchair bound, uses walker, uses rollator Skin: +changes in toenails, color change, dryness, itching, mole changes,  rash, wound(s) Neurological: headaches, numbness in feet, paresthesias in feet, burning in feet, fainting,  seizures, change in speech,  headaches, memory problems/poor historian, cerebral palsy, weakness, paralysis, CVA, TIA Endocrine: diabetes, hypothyroidism, hyperthyroidism,  goiter, dry mouth, flushing, heat intolerance,  cold intolerance,  excessive thirst, denies polyuria,  nocturia Hematological:  easy bleeding, excessive bleeding, easy bruising, enlarged lymph nodes, on long term blood thinner, history of past transusions Allergy/immunological:  hives, eczema, frequent infections, multiple drug allergies, seasonal allergies, transplant recipient, multiple food allergies Psychiatric:  anxiety, depression, mood disorder, suicidal ideations, hallucinations, insomnia  Objective: Vitals:   04/24/19 0859  BP: (!) 150/72  Pulse: 72   Vascular Examination: Capillary refill time immediate x 10 digits.  Dorsalis pedis pulses palpable b/l.  Posterior tibial pulses palpable b/l.  Digital hair sparse b/l.  Skin temperature gradient WNL b/l.  Dermatological Examination: Skin with normal turgor, texture and tone b/l.  Toenails 1-5 b/l discolored, thick, dystrophic with subungual debris and pain with palpation to nailbeds due to thickness of nails.  Musculoskeletal: Muscle strength 5/5 b/l to all LE muscle groups.  HAV with bunion deformity b/l.  Neurological: Sensation intact 5/5 b/l with 10 gram monofilament.  Vibratory sensation intact b/l.  Assessment: 1. Painful onychomycosis toenails 1-5 b/l   Plan: 1. Discussed diabetic foot  care principles. Literature dispensed on today. 2. Toenails 1-5 b/l were debrided in length and girth without iatrogenic bleeding. 3. Patient to continue soft, supportive shoe gear. Advised patient to change socks after gardening.  4. Patient to report any pedal injuries to medical professional immediately. 5. Follow up 3 months.  6. Patient/POA to call should there be a concern in the interim.

## 2019-05-07 ENCOUNTER — Encounter: Payer: Self-pay | Admitting: Certified Nurse Midwife

## 2019-05-08 ENCOUNTER — Other Ambulatory Visit: Payer: Self-pay | Admitting: Certified Nurse Midwife

## 2019-05-08 DIAGNOSIS — N952 Postmenopausal atrophic vaginitis: Secondary | ICD-10-CM

## 2019-05-08 MED ORDER — PREMARIN 0.625 MG/GM VA CREA: TOPICAL_CREAM | VAGINAL | 0 refills | Status: AC

## 2019-05-08 NOTE — Telephone Encounter (Signed)
She needs to have current mammogram to continue with use will refill x 1 only.

## 2019-05-08 NOTE — Telephone Encounter (Signed)
Medication refill request: premarin vaginal cream  Last AEX:  04-03-2019 DL  Next AEX: 04-03-20 Last MMG (if hormonal medication request): 04-27-17 BIRADS 2 benign  Refill authorized: Today, please advise  Medication pended for #42.5g, 0RF. Please refill if appropriate.

## 2019-05-08 NOTE — Telephone Encounter (Signed)
Patient's caregiver, Langley Gauss, is calling to request a refill on behalf of the patient. She is requesting a refill for the Premarin vaginal cream. Langley Gauss verified pharmacy as [facility] at [address] and [address].

## 2019-06-04 ENCOUNTER — Ambulatory Visit: Payer: Self-pay

## 2019-06-04 ENCOUNTER — Other Ambulatory Visit: Payer: Self-pay

## 2019-06-04 ENCOUNTER — Ambulatory Visit: Payer: Medicare Other | Admitting: Podiatry

## 2019-06-04 DIAGNOSIS — M2011 Hallux valgus (acquired), right foot: Secondary | ICD-10-CM

## 2019-06-04 DIAGNOSIS — B351 Tinea unguium: Secondary | ICD-10-CM

## 2019-06-04 DIAGNOSIS — M79609 Pain in unspecified limb: Secondary | ICD-10-CM

## 2019-06-04 DIAGNOSIS — M79676 Pain in unspecified toe(s): Secondary | ICD-10-CM

## 2019-06-04 NOTE — Progress Notes (Signed)
  Subjective:  Patient ID: Shari Kelly, female    DOB: 24-Dec-1940,  MRN: [id number] HPI Chief Complaint  Patient presents with  . Toe Pain    right great toe pain - her pain is at the very tip of her toe - it is difficult to determine if it is her nail or soft tissue. pain is not constant, it comes and goes randomly    78 y.o. female presents with the above complaint.   ROS: Denies fever chills nausea vomiting muscle aches pains calf pain back pain chest pain shortness of breath.  Past Medical History:  Diagnosis Date  . Anxiety   . Atrophic vaginitis   . HTN (hypertension) 2010  . Hyperlipidemia 08/2011  . Mitral valve disorders(424.0) 04/07/2009   now Dr. Caryl Comes feels this was a misdiagnosed problem  . Uterine prolapse 09/2009   use of pessary dish 3 "   Past Surgical History:  Procedure Laterality Date  . CATARACT EXTRACTION, BILATERAL     2018  . COLONOSCOPY  01/25/12   polyp recheck 3-5 years  . SKIN BIOPSY     brown spot, neg per patient  . TONSILLECTOMY AND ADENOIDECTOMY  age 54  . TUBAL LIGATION  30's  . WISDOM TOOTH EXTRACTION  mid 20's    Current Outpatient Medications:  .  amLODipine (NORVASC) 5 MG tablet, TAKE 1 TABLET BY MOUTH EVERY DAY, Disp: 30 tablet, Rfl: 6 .  Calcium Citrate-Vitamin D (CALCIUM + D PO), Take by mouth., Disp: , Rfl:  .  conjugated estrogens (PREMARIN) vaginal cream, Use 1/2 g vaginally 2 times weekly with pessary use, Disp: 42.5 g, Rfl: 0 .  donepezil (ARICEPT) 10 MG tablet, 5 mg. , Disp: , Rfl: 3  Allergies  Allergen Reactions  . Penicillins     REACTION: rash,mouth ulcers   Review of Systems Objective:  There were no vitals filed for this visit.  General: Well developed, nourished, in no acute distress, alert and oriented x3   Dermatological: Skin is warm, dry and supple bilateral. Nails x 10 are well maintained; remaining integument appears unremarkable at this time. There are no open sores, no preulcerative lesions, no rash or  signs of infection present.  Nail dystrophy hallux right  Vascular: Dorsalis Pedis artery and Posterior Tibial artery pedal pulses are 2/4 bilateral with immedate capillary fill time. Pedal hair growth present. No varicosities and no lower extremity edema present bilateral.   Neruologic: Grossly intact via light touch bilateral. Vibratory intact via tuning fork bilateral. Protective threshold with Semmes Wienstein monofilament intact to all pedal sites bilateral. Patellar and Achilles deep tendon reflexes 2+ bilateral. No Babinski or clonus noted bilateral.   Musculoskeletal: No gross boney pedal deformities bilateral. No pain, crepitus, or limitation noted with foot and ankle range of motion bilateral. Muscular strength 5/5 in all groups tested bilateral.  Gait: Unassisted, Nonantalgic.    Radiographs:  None taken  Assessment & Plan:   Assessment: Nail dystrophy with pain to the distal aspect of the right toe venous insufficiency bilateral.  Plan: I debrided the hallux nail right much more proximally today to help decrease pressure to the end of the toe.  Patient states that it felt better.     Max T. Bejou, [place]

## 2019-07-03 ENCOUNTER — Ambulatory Visit: Payer: Medicare Other | Admitting: Certified Nurse Midwife

## 2019-07-09 ENCOUNTER — Encounter: Payer: Self-pay | Admitting: Certified Nurse Midwife

## 2019-07-09 ENCOUNTER — Other Ambulatory Visit: Payer: Self-pay

## 2019-07-09 ENCOUNTER — Ambulatory Visit (INDEPENDENT_AMBULATORY_CARE_PROVIDER_SITE_OTHER): Payer: Medicare Other | Admitting: Certified Nurse Midwife

## 2019-07-09 VITALS — BP 110/70 | HR 64 | Temp 97.1°F | Resp 16 | Wt 117.0 lb

## 2019-07-09 DIAGNOSIS — Z4689 Encounter for fitting and adjustment of other specified devices: Secondary | ICD-10-CM

## 2019-07-09 DIAGNOSIS — N952 Postmenopausal atrophic vaginitis: Secondary | ICD-10-CM | POA: Diagnosis not present

## 2019-07-09 DIAGNOSIS — Z780 Asymptomatic menopausal state: Secondary | ICD-10-CM | POA: Diagnosis not present

## 2019-07-09 DIAGNOSIS — N8110 Cystocele, unspecified: Secondary | ICD-10-CM

## 2019-07-09 NOTE — Progress Notes (Signed)
Subjective:   79 y.o. Divorced White female [id number] here for pessary check.  Patient has been using following pessary style and size:  Dish pessary with support pessary #3.  She describes the following issues with the pessary:  None that she aware of. She brought her Premarin with her, but has not used as discussed. "Memory issues ". She is eating better and drinking Ensure daily. Weight has increased now to normal range. Has daily support person in home, now. Son oversees her care. No other issues today.   Use of protective clothing such as Depends or pads No..   Constipation issues with use of pessary No..  She is not sexually active.     ROS:   no breast pain or new or enlarging lumps on self exam,  no abnormal bleeding, pelvic pain or discharge,   no dysuria, trouble voiding or hematuria. Compliant to use of vaginal cream No.   General Exam:    LMP 07/04/1992 (Approximate)   General appearance: alert, cooperative and appears stated age   Pelvic: External genitalia:  no lesions and atrophic appearance   Before pessary was removed No. prolapse over the pessary   In correct position Yes.                Urethra: normal appearing urethra with no masses, tenderness or lesions              Vagina: normal appearing vagina with normal color and discharge, no lesions.  There are No abrasions or ulcerations.               Cervix: No cervical abrasions or ulcerations noted Bimanual Exam:  Uterus: uterus is normal size, shape, consistency and nontender                               Adnexa:   Normal adnexal no masses  Pessary was removed without difficulty without using forceps.  Pessary was cleansed with Betadine.  Pessary was replaced. Patient tolerated procedure well.    Assessment :     Normal pelvic exam       Cystocele grade 2 no change       Atrophic vaginitis using Premarin with good results, but only used once in last 3 months   Use of pessary continued   Plan:    Discussed normal exam  and no change in cystocele. Pessary working well and needs to be sure and use Premarin 1-2 times weekly. Discussed putting reminder on mirror so she would use as directed.( this was made for her to take home) Discussed with care provider importance only.   Warning signs with pessary use reviewed and need to advise.   RV 3 months for recheck.    An After Visit Summary was printed and given to the patient.

## 2019-07-31 ENCOUNTER — Other Ambulatory Visit: Payer: Self-pay

## 2019-07-31 ENCOUNTER — Ambulatory Visit: Payer: Medicare Other | Admitting: Podiatry

## 2019-07-31 ENCOUNTER — Encounter: Payer: Self-pay | Admitting: Podiatry

## 2019-07-31 DIAGNOSIS — B351 Tinea unguium: Secondary | ICD-10-CM

## 2019-07-31 DIAGNOSIS — M79676 Pain in unspecified toe(s): Secondary | ICD-10-CM | POA: Diagnosis not present

## 2019-07-31 NOTE — Patient Instructions (Signed)

## 2019-07-31 NOTE — Progress Notes (Signed)
Subjective: Shari Kelly presents today for follow up of painful mycotic nails b/l that are difficult to trim. Pain interferes with ambulation. Aggravating factors include wearing enclosed shoe gear. Pain is relieved with periodic professional debridement.   Allergies  Allergen Reactions  . Penicillins     REACTION: rash,mouth ulcers     Objective: There were no vitals filed for this visit.  Vascular Examination:  capillary refill time to digits immediate b/l, palpable DP pulses b/l, palpable PT pulses b/l, pedal hair sparse b/l and skin temperature gradient within normal limits b/l  Dermatological Examination: Pedal skin with normal turgor, texture and tone bilaterally, no open wounds bilaterally, no interdigital macerations bilaterally and toenails 1-5 b/l elongated, dystrophic, thickened, crumbly with subungual debris  Musculoskeletal: normal muscle strength 5/5 to all lower extremity muscle groups bilaterally, no pain crepitus or joint limitation noted with ROM b/l and bunion deformity noted b/l  Neurological: sensation intact 5/5 intact bilaterally with 10g monofilament b/l and vibratory sensation intact b/l  Assessment: 1. Pain due to onychomycosis of nail      Plan: -Toenails 1-5 b/l were debrided in length and girth without iatrogenic bleeding. -Patient to continue soft, supportive shoe gear daily. -Patient to report any pedal injuries to medical professional immediately. -Patient/POA to call should there be question/concern in the interim.  Return in about 3 months (around 10/29/2019) for nail trim.

## 2019-09-20 ENCOUNTER — Telehealth: Payer: Self-pay

## 2019-09-20 NOTE — Telephone Encounter (Signed)
Spoke to pt. Pt needing to reschedule her 3 month pessary check for April since [place], CNM retiring. Pt agreeable and appt changed to Dr Edward Jolly on 10/08/2019 at 4 pm. Pt verbalized understanding. AEX changed from DL to Dr Edward Jolly as well.   Routing to Dr Edward Jolly for review and will close encounter.

## 2019-09-20 NOTE — Telephone Encounter (Signed)
Reached out to patient to reschedule appointment. Patient was a patient with Dr. Darcel Bayley. Patient did not know if she wanted to reschedule with different provider.

## 2019-09-25 ENCOUNTER — Encounter: Payer: Self-pay | Admitting: Certified Nurse Midwife

## 2019-10-08 ENCOUNTER — Ambulatory Visit: Payer: Medicare Other | Admitting: Certified Nurse Midwife

## 2019-10-08 ENCOUNTER — Ambulatory Visit: Payer: Medicare Other | Admitting: Obstetrics and Gynecology

## 2019-10-08 NOTE — Progress Notes (Deleted)
GYNECOLOGY  VISIT   HPI: 79 y.o.   Divorced  Caucasian  female   G3P0012 with Patient's last menstrual period was 07/04/1992 (approximate).   here for 3 month pessary check.   GYNECOLOGIC HISTORY: Patient's last menstrual period was 07/04/1992 (approximate). Contraception: PMP Menopausal hormone therapy: Premarin cream Last mammogram: 05-07-19 3D/Neg/density B/BiRads1 Last pap smear: 03-27-18 Neg:Neg HR HPV, 03-03-15 Neg, 11-12-09 Neg        OB History    Gravida  3   Para  2   Term  0   Preterm  0   AB  1   Living  2     SAB  1   TAB  0   Ectopic  0   Multiple  0   Live Births  2              Patient Active Problem List   Diagnosis Date Noted  . Memory change 06/06/2018    Class: History of  . HTN (hypertension)   . MITRAL VALVE DISORDERS 04/07/2009    Past Medical History:  Diagnosis Date  . Anxiety   . Atrophic vaginitis   . HTN (hypertension) 2010  . Hyperlipidemia 08/2011  . Mitral valve disorders(424.0) 04/07/2009   now Dr. Graciela Husbands feels this was a misdiagnosed problem  . Uterine prolapse 09/2009   use of pessary dish 3 "    Past Surgical History:  Procedure Laterality Date  . CATARACT EXTRACTION, BILATERAL     2018  . COLONOSCOPY  01/25/12   polyp recheck 3-5 years  . SKIN BIOPSY     brown spot, neg per patient  . TONSILLECTOMY AND ADENOIDECTOMY  age 20  . TUBAL LIGATION  30's  . WISDOM TOOTH EXTRACTION  mid 20's    Current Outpatient Medications  Medication Sig Dispense Refill  . amLODipine (NORVASC) 5 MG tablet TAKE 1 TABLET BY MOUTH EVERY DAY 30 tablet 6  . Calcium Citrate-Vitamin D (CALCIUM + D PO) Take by mouth.    . conjugated estrogens (PREMARIN) vaginal cream Use 1/2 g vaginally 2 times weekly with pessary use 42.5 g 0  . donepezil (ARICEPT) 10 MG tablet 5 mg.   3   No current facility-administered medications for this visit.     ALLERGIES: Penicillins  Family History  Problem Relation Age of Onset  . Hypertension Mother    . Diverticulitis Mother   . Heart disease Father   . Hypertension Father   . Rheum arthritis Sister   . Diverticulitis Sister   . Coronary artery disease Unknown     Social History   Socioeconomic History  . Marital status: Divorced    Spouse name: Not on file  . Number of children: 2  . Years of education: Not on file  . Highest education level: Not on file  Occupational History  . Occupation: Retired    Comment: Lab work and Therapist, nutritional  Tobacco Use  . Smoking status: Former Smoker    Packs/day: 0.50    Years: 40.00    Pack years: 20.00    Types: Cigarettes    Quit date: 06/21/2006    Years since quitting: 13.3  . Smokeless tobacco: Never Used  Substance and Sexual Activity  . Alcohol use: Not Currently  . Drug use: No  . Sexual activity: Not Currently    Birth control/protection: Post-menopausal  Other Topics Concern  . Not on file  Social History Narrative   Son died at the end of 11-Feb-2023  2012, due to Cirrhosis    Social Determinants of Health   Financial Resource Strain:   . Difficulty of Paying Living Expenses:   Food Insecurity:   . Worried About Charity fundraiser in the Last Year:   . Arboriculturist in the Last Year:   Transportation Needs:   . Film/video editor (Medical):   Marland Kitchen Lack of Transportation (Non-Medical):   Physical Activity:   . Days of Exercise per Week:   . Minutes of Exercise per Session:   Stress:   . Feeling of Stress :   Social Connections:   . Frequency of Communication with Friends and Family:   . Frequency of Social Gatherings with Friends and Family:   . Attends Religious Services:   . Active Member of Clubs or Organizations:   . Attends Archivist Meetings:   Marland Kitchen Marital Status:   Intimate Partner Violence:   . Fear of Current or Ex-Partner:   . Emotionally Abused:   Marland Kitchen Physically Abused:   . Sexually Abused:     Review of Systems  PHYSICAL EXAMINATION:    LMP 07/04/1992 (Approximate)     General  appearance: alert, cooperative and appears stated age Head: Normocephalic, without obvious abnormality, atraumatic Neck: no adenopathy, supple, symmetrical, trachea midline and thyroid normal to inspection and palpation Lungs: clear to auscultation bilaterally Breasts: normal appearance, no masses or tenderness, No nipple retraction or dimpling, No nipple discharge or bleeding, No axillary or supraclavicular adenopathy Heart: regular rate and rhythm Abdomen: soft, non-tender, no masses,  no organomegaly Extremities: extremities normal, atraumatic, no cyanosis or edema Skin: Skin color, texture, turgor normal. No rashes or lesions Lymph nodes: Cervical, supraclavicular, and axillary nodes normal. No abnormal inguinal nodes palpated Neurologic: Grossly normal  Pelvic: External genitalia:  no lesions              Urethra:  normal appearing urethra with no masses, tenderness or lesions              Bartholins and Skenes: normal                 Vagina: normal appearing vagina with normal color and discharge, no lesions              Cervix: no lesions                Bimanual Exam:  Uterus:  normal size, contour, position, consistency, mobility, non-tender              Adnexa: no mass, fullness, tenderness              Rectal exam: {yes no:314532}.  Confirms.              Anus:  normal sphincter tone, no lesions  Chaperone was present for exam.  ASSESSMENT     PLAN     An After Visit Summary was printed and given to the patient.  ______ minutes face to face time of which over 50% was spent in counseling.

## 2019-10-11 ENCOUNTER — Telehealth: Payer: Self-pay | Admitting: Internal Medicine

## 2019-10-11 NOTE — Telephone Encounter (Signed)
Spoke with pt.  Pt reports per home health pt's B/P has been running low.  Pt states she feels well and has no symptoms of weakness, fatigue or dizziness.  Pt denies CP or SOB.  Pt has not been seen in this cardiology office since 2014.  Pt states she is not sure what medications she is currently taking but is responsible for her own medicines.  Pt advised she can reach out to her PCP for further concerns about her B/P or if she develops any symptoms as listed above she should go to ED for further evaluation.  Pt verbalized understanding and agrees with current plan.

## 2019-10-11 NOTE — Telephone Encounter (Signed)
New Message:     [facility] Nurse is calling about Pt's B;ood Pressure   Pt c/o BP issue: STAT if pt c/o blurred vision, one-sided weakness or slurred speech  1. What are your last 5 BP readings? 10-03-19- 118/60 10-09-19 102/67  10-10-19 99/50 and toda was 100/50  2. Are you having any other symptoms (ex. Dizziness, headache, blurred vision, passed out)?  no  3. What is your BP issue?  Blood Pressure is low for pt- it keep dropping- Nolberto Hanlon wants you to call the patient   [phone]

## 2019-10-14 ENCOUNTER — Telehealth: Payer: Self-pay | Admitting: Obstetrics and Gynecology

## 2019-10-14 NOTE — Telephone Encounter (Signed)
Spoke with patient. Patient requested I call her son, "Shari Kelly" to reschedule OV.   Call placed to [facility] OV rescheduled to 4/27 at 1pm with Dr. Edward Jolly. Son declined earlier appt.   Advised to complete updated dpr while in office. Son does not come to appointments with his mother, home health brings her. Son request dpr be mailed to [address] [place], [place] [address]. He will complete and return to [facility]. Son verbalizes understanding and is agreeable.   Form placed in [place] Mail.   Encounter closed.

## 2019-10-14 NOTE — Telephone Encounter (Signed)
Patient need 3 month pessary recheck with provider. Cancelled from Dr Rica Records schedule and will need assistance rescheduling.

## 2019-10-15 ENCOUNTER — Ambulatory Visit: Payer: Medicare Other | Admitting: Obstetrics and Gynecology

## 2019-10-28 NOTE — Progress Notes (Signed)
GYNECOLOGY  VISIT   HPI: 79 y.o.   Divorced  Caucasian  female   G3P0012 with Patient's last menstrual period was 07/04/1992 (approximate).   here for 3 month pessary check.   A caregiver is present for the visit today.   Patient has a second degree cystocele and is using a dish pessary.  No pain or discomfort.  Denies vaginal discharge.  Voiding well and no urinary incontinence.  Good bowel function.  Daily BM.  No problems maintaining the pessary.   She did bring her estrogen cream.   Patient with some memory issues. Hard to clarify medications.  GYNECOLOGIC HISTORY: Patient's last menstrual period was 07/04/1992 (approximate). Contraception: PMP Menopausal hormone therapy: Premarin cream Last mammogram: 05-07-19 3D/neg/density B/BiRads1 Last pap smear: 03-27-18 Neg:Neg HR HPV, 03-02-18 Neg        OB History    Gravida  3   Para  2   Term  0   Preterm  0   AB  1   Living  2     SAB  1   TAB  0   Ectopic  0   Multiple  0   Live Births  2              Patient Active Problem List   Diagnosis Date Noted  . Memory change 06/06/2018    Class: History of  . HTN (hypertension)   . MITRAL VALVE DISORDERS 04/07/2009    Past Medical History:  Diagnosis Date  . Anxiety   . Atrophic vaginitis   . HTN (hypertension) 2010  . Hyperlipidemia 08/2011  . Mitral valve disorders(424.0) 04/07/2009   now Dr. Caryl Comes feels this was a misdiagnosed problem  . Uterine prolapse 09/2009   use of pessary dish 3 "    Past Surgical History:  Procedure Laterality Date  . CATARACT EXTRACTION, BILATERAL     2018  . COLONOSCOPY  01/25/12   polyp recheck 3-5 years  . SKIN BIOPSY     brown spot, neg per patient  . TONSILLECTOMY AND ADENOIDECTOMY  age 33  . TUBAL LIGATION  30's  . WISDOM TOOTH EXTRACTION  mid 20's    Current Outpatient Medications  Medication Sig Dispense Refill  . amLODipine (NORVASC) 5 MG tablet TAKE 1 TABLET BY MOUTH EVERY DAY 30 tablet 6  . Calcium  Citrate-Vitamin D (CALCIUM + D PO) Take by mouth.    . conjugated estrogens (PREMARIN) vaginal cream Use 1/2 g vaginally 2 times weekly with pessary use 42.5 g 0  . donepezil (ARICEPT) 10 MG tablet 5 mg.   3   No current facility-administered medications for this visit.     ALLERGIES: Penicillins  Family History  Problem Relation Age of Onset  . Hypertension Mother   . Diverticulitis Mother   . Heart disease Father   . Hypertension Father   . Rheum arthritis Sister   . Diverticulitis Sister   . Coronary artery disease Other     Social History   Socioeconomic History  . Marital status: Divorced    Spouse name: Not on file  . Number of children: 2  . Years of education: Not on file  . Highest education level: Not on file  Occupational History  . Occupation: Retired    Comment: Lab work and Airline pilot  Tobacco Use  . Smoking status: Former Smoker    Packs/day: 0.50    Years: 40.00    Pack years: 20.00    Types: Cigarettes  Quit date: 06/21/2006    Years since quitting: 13.3  . Smokeless tobacco: Never Used  Substance and Sexual Activity  . Alcohol use: Not Currently  . Drug use: No  . Sexual activity: Not Currently    Birth control/protection: Post-menopausal  Other Topics Concern  . Not on file  Social History Narrative   Son died at the end of 20-Jul-2012due to Cirrhosis    Social Determinants of Health   Financial Resource Strain:   . Difficulty of Paying Living Expenses:   Food Insecurity:   . Worried About Programme researcher, broadcasting/film/video in the Last Year:   . Barista in the Last Year:   Transportation Needs:   . Freight forwarder (Medical):   Marland Kitchen Lack of Transportation (Non-Medical):   Physical Activity:   . Days of Exercise per Week:   . Minutes of Exercise per Session:   Stress:   . Feeling of Stress :   Social Connections:   . Frequency of Communication with Friends and Family:   . Frequency of Social Gatherings with Friends and Family:    . Attends Religious Services:   . Active Member of Clubs or Organizations:   . Attends Banker Meetings:   Marland Kitchen Marital Status:   Intimate Partner Violence:   . Fear of Current or Ex-Partner:   . Emotionally Abused:   Marland Kitchen Physically Abused:   . Sexually Abused:     Review of Systems  All other systems reviewed and are negative.   PHYSICAL EXAMINATION:    BP 124/64   Pulse 66   Temp (!) 97.3 F (36.3 C) (Temporal)   Ht 5\' 3"  (1.6 m)   Wt 116 lb (52.6 kg)   LMP 07/04/1992 (Approximate)   BMI 20.55 kg/m     General appearance: alert, cooperative and appears stated age   Pelvic: External genitalia:  no lesions              Urethra:  normal appearing urethra with no masses, tenderness or lesions              Bartholins and Skenes: normal                 Vagina: normal appearing vagina with normal color and discharge,erythema from pessary contact at posterior vaginal cuff.  2 areas each about 1 cm.               Cervix: no lesions                Bimanual Exam:  Uterus:  normal size, contour, position, consistency, mobility, non-tender              Adnexa: no mass, fullness, tenderness            Pessary removed, cleansed, and replaced.   Chaperone was present for exam.  ASSESSMENT  Pessary maintenance.  Inflammation from pessary use.   PLAN  OK to continue incontinence dish.  I clarified use of vaginal estrogen 1/2 gm pv at hs twice weekly and for all GYN office visits.  FU in 6 weeks.   An After Visit Summary was printed and given to the patient.  __20____ minutes face to face time of which over 50% was spent in counseling.

## 2019-10-29 ENCOUNTER — Other Ambulatory Visit: Payer: Self-pay

## 2019-10-29 ENCOUNTER — Ambulatory Visit: Payer: Medicare Other | Admitting: Obstetrics and Gynecology

## 2019-10-29 ENCOUNTER — Encounter: Payer: Self-pay | Admitting: Obstetrics and Gynecology

## 2019-10-29 VITALS — BP 124/64 | HR 66 | Temp 97.3°F | Ht 63.0 in | Wt 116.0 lb

## 2019-10-29 DIAGNOSIS — T8369XA Infection and inflammatory reaction due to other prosthetic device, implant and graft in genital tract, initial encounter: Secondary | ICD-10-CM | POA: Diagnosis not present

## 2019-10-29 DIAGNOSIS — Z4689 Encounter for fitting and adjustment of other specified devices: Secondary | ICD-10-CM | POA: Diagnosis not present

## 2019-10-29 DIAGNOSIS — N760 Acute vaginitis: Secondary | ICD-10-CM | POA: Diagnosis not present

## 2019-10-29 NOTE — Patient Instructions (Signed)
Your vaginal estrogen cream can be used in the vagina twice a week.  Use 1/2 gram with each application.   Please bring the estrogen cream to all gynecology visits.

## 2019-10-30 ENCOUNTER — Ambulatory Visit: Payer: Medicare Other | Admitting: Podiatry

## 2019-11-27 ENCOUNTER — Other Ambulatory Visit: Payer: Self-pay

## 2019-11-27 ENCOUNTER — Emergency Department (HOSPITAL_COMMUNITY)
Admission: EM | Admit: 2019-11-27 | Discharge: 2019-11-27 | Disposition: A | Discharge status: Home / Self Care | Payer: Medicare Other | Attending: Emergency Medicine | Admitting: Emergency Medicine

## 2019-11-27 ENCOUNTER — Encounter (HOSPITAL_COMMUNITY): Payer: Self-pay

## 2019-11-27 DIAGNOSIS — I10 Essential (primary) hypertension: Secondary | ICD-10-CM | POA: Insufficient documentation

## 2019-11-27 DIAGNOSIS — Z87891 Personal history of nicotine dependence: Secondary | ICD-10-CM | POA: Diagnosis not present

## 2019-11-27 DIAGNOSIS — E860 Dehydration: Secondary | ICD-10-CM | POA: Diagnosis not present

## 2019-11-27 DIAGNOSIS — Z79899 Other long term (current) drug therapy: Secondary | ICD-10-CM | POA: Insufficient documentation

## 2019-11-27 DIAGNOSIS — R410 Disorientation, unspecified: Secondary | ICD-10-CM | POA: Diagnosis not present

## 2019-11-27 LAB — URINALYSIS, ROUTINE W REFLEX MICROSCOPIC
Bacteria, UA: NONE SEEN
Bilirubin Urine: NEGATIVE
Glucose, UA: NEGATIVE mg/dL
Hgb urine dipstick: NEGATIVE
Ketones, ur: 20 mg/dL — AB
Nitrite: NEGATIVE
Protein, ur: NEGATIVE mg/dL
Specific Gravity, Urine: 1.015 (ref 1.005–1.030)
pH: 7 (ref 5.0–8.0)

## 2019-11-27 LAB — CK: Total CK: 116 U/L (ref 38–234)

## 2019-11-27 LAB — COMPREHENSIVE METABOLIC PANEL
ALT: 19 U/L (ref 0–44)
AST: 27 U/L (ref 15–41)
Albumin: 4.3 g/dL (ref 3.5–5.0)
Alkaline Phosphatase: 73 U/L (ref 38–126)
Anion gap: 10 (ref 5–15)
BUN: 27 mg/dL — ABNORMAL HIGH (ref 8–23)
CO2: 25 mmol/L (ref 22–32)
Calcium: 8.7 mg/dL — ABNORMAL LOW (ref 8.9–10.3)
Chloride: 108 mmol/L (ref 98–111)
Creatinine, Ser: 1.14 mg/dL — ABNORMAL HIGH (ref 0.44–1.00)
GFR calc Af Amer: 54 mL/min — ABNORMAL LOW (ref >60)
GFR calc non Af Amer: 46 mL/min — ABNORMAL LOW (ref >60)
Glucose, Bld: 93 mg/dL (ref 70–99)
Potassium: 4.3 mmol/L (ref 3.5–5.1)
Sodium: 143 mmol/L (ref 135–145)
Total Bilirubin: 0.7 mg/dL (ref 0.3–1.2)
Total Protein: 6.7 g/dL (ref 6.5–8.1)

## 2019-11-27 LAB — CBC WITH DIFFERENTIAL/PLATELET
Abs Immature Granulocytes: 0.01 10*3/uL (ref 0.00–0.07)
Basophils Absolute: 0 10*3/uL (ref 0.0–0.1)
Basophils Relative: 1 %
Eosinophils Absolute: 0.1 10*3/uL (ref 0.0–0.5)
Eosinophils Relative: 2 %
HCT: 38.1 % (ref 36.0–46.0)
Hemoglobin: 12.7 g/dL (ref 12.0–15.0)
Immature Granulocytes: 0 %
Lymphocytes Relative: 24 %
Lymphs Abs: 1.4 10*3/uL (ref 0.7–4.0)
MCH: 32.5 pg (ref 26.0–34.0)
MCHC: 33.3 g/dL (ref 30.0–36.0)
MCV: 97.4 fL (ref 80.0–100.0)
Monocytes Absolute: 0.5 10*3/uL (ref 0.1–1.0)
Monocytes Relative: 8 %
Neutro Abs: 3.9 10*3/uL (ref 1.7–7.7)
Neutrophils Relative %: 65 %
Platelets: 200 10*3/uL (ref 150–400)
RBC: 3.91 MIL/uL (ref 3.87–5.11)
RDW: 12.7 % (ref 11.5–15.5)
WBC: 6 10*3/uL (ref 4.0–10.5)
nRBC: 0 % (ref 0.0–0.2)

## 2019-11-27 LAB — AMMONIA: Ammonia: 45 umol/L — ABNORMAL HIGH (ref 9–35)

## 2019-11-27 MED ORDER — SODIUM CHLORIDE 0.9 % IV BOLUS
1000.0000 mL | Freq: Once | INTRAVENOUS | Status: AC
  Administered 2019-11-27: 1000 mL via INTRAVENOUS

## 2019-11-27 NOTE — Discharge Instructions (Signed)
Your work-up today was overall reassuring but I do suspect you are dehydrated.  I suspect that you were disoriented and confused for wandering through the today but your work-up was otherwise reassuring.  After fluids and reassessment, we feel you are safe for discharge home.  Please rest and stay hydrated and follow-up with your primary doctor.

## 2019-11-27 NOTE — ED Provider Notes (Signed)
[facility] DEPT Provider Note   CSN: [id number] Arrival date & time: 11/27/19  1526     History No chief complaint on file.   Shari Kelly is a 79 y.o. female.  The history is provided by the patient and medical records.  Altered Mental Status Presenting symptoms: confusion and disorientation   Severity:  Mild Episode history:  Unable to specify Timing:  Constant Progression:  Improving Chronicity:  Chronic Context: dementia   Context: not head injury   Associated symptoms: no abdominal pain, normal movement, no agitation, no depression, no fever, no headaches, no light-headedness, no nausea, no palpitations, no rash, no seizures, no vomiting and no weakness        Past Medical History:  Diagnosis Date  . Anxiety   . Atrophic vaginitis   . HTN (hypertension) 2010  . Hyperlipidemia 08/2011  . Mitral valve disorders(424.0) 04/07/2009   now Dr. Caryl Comes feels this was a misdiagnosed problem  . Uterine prolapse 09/2009   use of pessary dish 3 "    Patient Active Problem List   Diagnosis Date Noted  . Memory change 06/06/2018    Class: History of  . HTN (hypertension)   . MITRAL VALVE DISORDERS 04/07/2009    Past Surgical History:  Procedure Laterality Date  . CATARACT EXTRACTION, BILATERAL     2018  . COLONOSCOPY  01/25/12   polyp recheck 3-5 years  . SKIN BIOPSY     brown spot, neg per patient  . TONSILLECTOMY AND ADENOIDECTOMY  age 29  . TUBAL LIGATION  30's  . WISDOM TOOTH EXTRACTION  mid 20's     OB History    Gravida  3   Para  2   Term  0   Preterm  0   AB  1   Living  2     SAB  1   TAB  0   Ectopic  0   Multiple  0   Live Births  2           Family History  Problem Relation Age of Onset  . Hypertension Mother   . Diverticulitis Mother   . Heart disease Father   . Hypertension Father   . Rheum arthritis Sister   . Diverticulitis Sister   . Coronary artery disease Other     Social  History   Tobacco Use  . Smoking status: Former Smoker    Packs/day: 0.50    Years: 40.00    Pack years: 20.00    Types: Cigarettes    Quit date: 06/21/2006    Years since quitting: 13.4  . Smokeless tobacco: Never Used  Substance Use Topics  . Alcohol use: Not Currently  . Drug use: No    Home Medications Prior to Admission medications   Medication Sig Start Date End Date Taking? Authorizing Provider  amLODipine (NORVASC) 5 MG tablet TAKE 1 TABLET BY MOUTH EVERY DAY 03/19/14   Deboraha Sprang, MD  Calcium Citrate-Vitamin D (CALCIUM + D PO) Take by mouth.    [provider]  conjugated estrogens (PREMARIN) vaginal cream Use 1/2 g vaginally 2 times weekly with pessary use 05/08/19   Regina Eck, CNM  donepezil (ARICEPT) 10 MG tablet 5 mg.  12/19/17   [provider]    Allergies    Penicillins  Review of Systems   Review of Systems  Constitutional: Positive for diaphoresis. Negative for chills, fatigue and fever.  HENT: Negative for congestion.  Eyes: Negative for visual disturbance.  Respiratory: Negative for cough, chest tightness, shortness of breath, wheezing and stridor.   Cardiovascular: Negative for palpitations.  Gastrointestinal: Negative for abdominal pain, constipation, diarrhea, nausea and vomiting.  Genitourinary: Negative for dysuria.  Musculoskeletal: Negative for back pain and neck pain.  Skin: Negative for pallor, rash and wound.  Neurological: Negative for dizziness, seizures, weakness, light-headedness, numbness and headaches.  Psychiatric/Behavioral: Positive for confusion. Negative for agitation.  All other systems reviewed and are negative.   Physical Exam Updated Vital Signs BP (!) 150/84   Pulse 71   Temp 98 F (36.7 C) (Oral)   Resp 18   LMP 07/04/1992 (Approximate)   SpO2 100%   Physical Exam Vitals and nursing note reviewed.  Constitutional:      General: She is not in acute distress.    Appearance: She is  well-developed. She is not ill-appearing, toxic-appearing or diaphoretic.  HENT:     Head: Normocephalic and atraumatic.     Right Ear: External ear normal.     Left Ear: External ear normal.     Nose: Nose normal. No congestion or rhinorrhea.     Mouth/Throat:     Mouth: Mucous membranes are dry.     Pharynx: No oropharyngeal exudate or posterior oropharyngeal erythema.  Eyes:     Extraocular Movements: Extraocular movements intact.     Conjunctiva/sclera: Conjunctivae normal.     Pupils: Pupils are equal, round, and reactive to light.  Cardiovascular:     Rate and Rhythm: Normal rate.     Pulses: Normal pulses.     Heart sounds: No murmur.  Pulmonary:     Effort: No respiratory distress.     Breath sounds: No stridor.  Abdominal:     General: Abdomen is flat. There is no distension.     Tenderness: There is no abdominal tenderness. There is no right CVA tenderness, left CVA tenderness or rebound.  Musculoskeletal:     Cervical back: Normal range of motion and neck supple. Tenderness present.  Skin:    General: Skin is warm.     Capillary Refill: Capillary refill takes less than 2 seconds.     Findings: No erythema or rash.  Neurological:     General: No focal deficit present.     Mental Status: She is alert. Mental status is at baseline. She is disoriented.     Cranial Nerves: No cranial nerve deficit.     Sensory: No sensory deficit.     Motor: No weakness or abnormal muscle tone.     Coordination: Coordination normal.     Gait: Gait normal.     Deep Tendon Reflexes: Reflexes are normal and symmetric.  Psychiatric:        Mood and Affect: Mood normal.     ED Results / Procedures / Treatments   Labs (all labs ordered are listed, but only abnormal results are displayed) Labs Reviewed  COMPREHENSIVE METABOLIC PANEL - Abnormal; Notable for the following components:      Result Value   BUN 27 (*)    Creatinine, Ser 1.14 (*)    Calcium 8.7 (*)    GFR calc non Af Amer  46 (*)    GFR calc Af Amer 54 (*)    All other components within normal limits  AMMONIA - Abnormal; Notable for the following components:   Ammonia 45 (*)    All other components within normal limits  URINALYSIS, ROUTINE W REFLEX MICROSCOPIC - Abnormal; Notable  for the following components:   Ketones, ur 20 (*)    Leukocytes,Ua TRACE (*)    All other components within normal limits  URINE CULTURE  CBC WITH DIFFERENTIAL/PLATELET  CK    EKG EKG Interpretation  Date/Time:  Wednesday Nov 27 2019 16:41:27 EDT Ventricular Rate:  61 PR Interval:    QRS Duration: 97 QT Interval:  449 QTC Calculation: 453 R Axis:   24 Text Interpretation: Sinus rhythm Low voltage, precordial leads RSR' in V1 or V2, probably normal variant 12 Lead; Mason-Likar When compared to prior, resolution of T wave inversion in lead 3. no STEMI Confirmed by Theda Belfast ([id number]) on 11/27/2019 5:19:07 PM   Radiology No results found.  Procedures Procedures (including critical care time)  Medications Ordered in ED Medications  sodium chloride 0.9 % bolus 1,000 mL (0 mLs Intravenous Stopped 11/27/19 1741)    ED Course  I have reviewed the triage vital signs and the nursing notes.  Pertinent labs & imaging results that were available during my care of the patient were reviewed by me and considered in my medical decision making (see chart for details).    MDM Rules/Calculators/A&P                      Shari Kelly is a 79 y.o. female with a past medical history significant for dementia per family report to nursing, hypertension, hyperlipidemia, mitral valve disorder, and anxiety who presents for disorientation and altered mental status.  According to EMS, patient was found wandering in a public location in the heat and a bystander found her to be confused and disoriented.  She was diaphoretic on scene and hot in the sun.  She denies other complaints aside from feeling tired and having a dry mouth.  She was  brought to the emergency department evaluation was given 500 cc normal saline in route.  On arrival, patient reports that she got disoriented and lost while trying to walk to the grocery store today.  She reports she was at her baseline had no other preceding symptoms specifically no fevers, chills, chest pain or shortness of breath, headache, neck pain, neck stiffness, nausea, vomiting, urinary symptoms, or GI symptoms.  She denies any URI symptoms.  She reports has been vaccinated for COVID-19.  She says that she simply got turned around while walking to the grocery store and then was found by somebody while she was wanting to eat.  She thinks she may have gotten a little bit hot and dehydrated being on the sun.  On exam, lungs are clear and chest is nontender.  Slight murmur.  Abdomen nontender.  She is with all extremities.  No other abnormalities on exam.  She is wanted to person.  According to family reports nursing, she is at her mental status baseline currently.  Patient resting comfortably.  She does have dry mucous membranes.  After discussion with patient, we agreed to get some screening labs as well as give her some fluids and make sure there is not some underlying abnormality which may have led her to be more confused.  Given her lack of trauma, we agreed to hold on any head CT or imaging at this time.  If patient is feeling better after rehydration and labs are reassuring, dissipate discharge for disorientation related to her dementia and feeling dehydrated being on the sun today.  Vital signs reassuring on my initial evaluation.  Labs showed some ketones in the urine but no infection.  Mildly elevated creatinine up from prior at 1.1.  No evidence of severe AKI.  Other labs overall reassuring.  Mild elevation in ammonia.  Clinical aspect was dehydrated wandering through the heat causing her to be disoriented and confused.  After fluids, she is feeling much better and would like to go home.  Son  is here with her and he would like to take her home.  She will follow with PCP and understands return precautions.  Overall patient appears well now.  Patient will be discharged in good condition.    Final Clinical Impression(s) / ED Diagnoses Final diagnoses:  Dehydration  Confusion    Rx / DC Orders ED Discharge Orders    None      Clinical Impression: 1. Dehydration   2. Confusion     Disposition: Discharge  Condition: Good  I have discussed the results, Dx and Tx plan with the pt(& family if present). He/she/they expressed understanding and agree(s) with the plan. Discharge instructions discussed at great length. Strict return precautions discussed and pt &/or family have verbalized understanding of the instructions. No further questions at time of discharge.    New Prescriptions   No medications on file    Follow Up: Kirby Funk, MD [address] [place] [address] [phone]     [facility] [address] [id number] mc [address] [place] [address] [phone]       Ibraheem Voris, Canary Brim, MD 11/28/19 [phone]

## 2019-11-27 NOTE — ED Triage Notes (Signed)
Arrived via [facility] from public location CC AMS A/OX0 Bystander called 911. Per EMS upon arrival pt seems less confused than before A/OX2. Pt been in sun for unknown amount of time, diaphoretic on scene, pt denies cramping or NV. Pt reports dry mouth.  Per EMS Stroke screen negative   18G LAC 500 ml NS   Hx unknown by EMS at this time, baseline unknown, family contacts not available. Pt poor historian

## 2019-11-27 NOTE — ED Notes (Signed)
Messaged provider for re-eval 

## 2019-11-27 NOTE — ED Notes (Signed)
Messaged provider for re-eval

## 2019-11-27 NOTE — ED Notes (Addendum)
Pt present during triage process a/OX1 to person. Pt has difficult with other questions. Pts chart refects " memory change but no diagnosis.   Called pts son that reports pt has dementia

## 2019-11-27 NOTE — ED Notes (Signed)
Pt verbalizes understanding of DC instructions. Pt belongings returned and is ambulatory out of ED.  

## 2019-11-29 LAB — URINE CULTURE

## 2019-12-05 ENCOUNTER — Ambulatory Visit: Payer: Medicare Other | Admitting: Podiatry

## 2019-12-18 NOTE — Progress Notes (Deleted)
GYNECOLOGY  VISIT   HPI: 79 y.o.   Divorced  Caucasian  female   G3P0012 with Patient's last menstrual period was 07/04/1992 (approximate).   here for pessary check.   GYNECOLOGIC HISTORY: Patient's last menstrual period was 07/04/1992 (approximate). Contraception:  PMP Menopausal hormone therapy: Premarin cream Last mammogram:  05-07-19 3D/neg/density B/BiRads1 Last pap smear:  03-27-18 Neg:Neg HR HPV, 03-02-18 Neg        OB History    Gravida  3   Para  2   Term  0   Preterm  0   AB  1   Living  2     SAB  1   TAB  0   Ectopic  0   Multiple  0   Live Births  2              Patient Active Problem List   Diagnosis Date Noted  . Memory change 06/06/2018    Class: History of  . HTN (hypertension)   . MITRAL VALVE DISORDERS 04/07/2009    Past Medical History:  Diagnosis Date  . Anxiety   . Atrophic vaginitis   . HTN (hypertension) 2010  . Hyperlipidemia 08/2011  . Mitral valve disorders(424.0) 04/07/2009   now Dr. Caryl Comes feels this was a misdiagnosed problem  . Uterine prolapse 09/2009   use of pessary dish 3 "    Past Surgical History:  Procedure Laterality Date  . CATARACT EXTRACTION, BILATERAL     2018  . COLONOSCOPY  01/25/12   polyp recheck 3-5 years  . SKIN BIOPSY     brown spot, neg per patient  . TONSILLECTOMY AND ADENOIDECTOMY  age 75  . TUBAL LIGATION  30's  . WISDOM TOOTH EXTRACTION  mid 20's    Current Outpatient Medications  Medication Sig Dispense Refill  . amLODipine (NORVASC) 5 MG tablet TAKE 1 TABLET BY MOUTH EVERY DAY 30 tablet 6  . Calcium Citrate-Vitamin D (CALCIUM + D PO) Take by mouth.    . conjugated estrogens (PREMARIN) vaginal cream Use 1/2 g vaginally 2 times weekly with pessary use 42.5 g 0  . donepezil (ARICEPT) 10 MG tablet 5 mg.   3   No current facility-administered medications for this visit.     ALLERGIES: Penicillins  Family History  Problem Relation Age of Onset  . Hypertension Mother   . Diverticulitis  Mother   . Heart disease Father   . Hypertension Father   . Rheum arthritis Sister   . Diverticulitis Sister   . Coronary artery disease Other     Social History   Socioeconomic History  . Marital status: Divorced    Spouse name: Not on file  . Number of children: 2  . Years of education: Not on file  . Highest education level: Not on file  Occupational History  . Occupation: Retired    Comment: Lab work and Airline pilot  Tobacco Use  . Smoking status: Former Smoker    Packs/day: 0.50    Years: 40.00    Pack years: 20.00    Types: Cigarettes    Quit date: 06/21/2006    Years since quitting: 13.5  . Smokeless tobacco: Never Used  Substance and Sexual Activity  . Alcohol use: Not Currently  . Drug use: No  . Sexual activity: Not Currently    Birth control/protection: Post-menopausal  Other Topics Concern  . Not on file  Social History Narrative   Son died at the end of 2011-01-22,  due to Cirrhosis    Social Determinants of Health   Financial Resource Strain:   . Difficulty of Paying Living Expenses:   Food Insecurity:   . Worried About Programme researcher, broadcasting/film/video in the Last Year:   . Barista in the Last Year:   Transportation Needs:   . Freight forwarder (Medical):   Marland Kitchen Lack of Transportation (Non-Medical):   Physical Activity:   . Days of Exercise per Week:   . Minutes of Exercise per Session:   Stress:   . Feeling of Stress :   Social Connections:   . Frequency of Communication with Friends and Family:   . Frequency of Social Gatherings with Friends and Family:   . Attends Religious Services:   . Active Member of Clubs or Organizations:   . Attends Banker Meetings:   Marland Kitchen Marital Status:   Intimate Partner Violence:   . Fear of Current or Ex-Partner:   . Emotionally Abused:   Marland Kitchen Physically Abused:   . Sexually Abused:     Review of Systems  PHYSICAL EXAMINATION:    LMP 07/04/1992 (Approximate)     General appearance: alert,  cooperative and appears stated age Head: Normocephalic, without obvious abnormality, atraumatic Neck: no adenopathy, supple, symmetrical, trachea midline and thyroid normal to inspection and palpation Lungs: clear to auscultation bilaterally Breasts: normal appearance, no masses or tenderness, No nipple retraction or dimpling, No nipple discharge or bleeding, No axillary or supraclavicular adenopathy Heart: regular rate and rhythm Abdomen: soft, non-tender, no masses,  no organomegaly Extremities: extremities normal, atraumatic, no cyanosis or edema Skin: Skin color, texture, turgor normal. No rashes or lesions Lymph nodes: Cervical, supraclavicular, and axillary nodes normal. No abnormal inguinal nodes palpated Neurologic: Grossly normal  Pelvic: External genitalia:  no lesions              Urethra:  normal appearing urethra with no masses, tenderness or lesions              Bartholins and Skenes: normal                 Vagina: normal appearing vagina with normal color and discharge, no lesions              Cervix: no lesions                Bimanual Exam:  Uterus:  normal size, contour, position, consistency, mobility, non-tender              Adnexa: no mass, fullness, tenderness              Rectal exam: {yes no:314532}.  Confirms.              Anus:  normal sphincter tone, no lesions  Chaperone was present for exam.  ASSESSMENT     PLAN     An After Visit Summary was printed and given to the patient.  ______ minutes face to face time of which over 50% was spent in counseling.

## 2019-12-19 ENCOUNTER — Ambulatory Visit: Payer: Medicare Other | Admitting: Obstetrics and Gynecology

## 2019-12-19 ENCOUNTER — Encounter: Payer: Self-pay | Admitting: Obstetrics and Gynecology

## 2020-01-17 ENCOUNTER — Ambulatory Visit: Payer: Medicare Other | Admitting: Podiatry

## 2020-04-03 ENCOUNTER — Ambulatory Visit: Payer: Medicare Other | Admitting: Certified Nurse Midwife

## 2020-04-06 ENCOUNTER — Ambulatory Visit: Payer: Medicare Other | Admitting: Obstetrics and Gynecology

## 2020-04-06 ENCOUNTER — Encounter: Payer: Self-pay | Admitting: Obstetrics and Gynecology

## 2020-04-06 NOTE — Progress Notes (Deleted)
79 y.o. G29P0012 Divorced Caucasian female here for annual exam.    PCP:     Patient's last menstrual period was 07/04/1992 (approximate).           Sexually active: {yes no:314532}  The current method of family planning is post menopausal status.    Exercising: {yes no:314532}  {types:19826} Smoker:  no  Health Maintenance: Pap:  03-27-18 Neg:Neg HR HPV, 03-03-15 Neg History of abnormal Pap:  {YES NO:22349} MMG: 05-07-19 3D/Neg/density B/BiRads1 Colonoscopy:  2013 BMD:04-27-17  Result :Normal TDaP: ***2011 Gardasil:   no GOT:LXBWIO Hep C: Unsure Screening Labs:  Hb today: ***, Urine today: ***   reports that she quit smoking about 13 years ago. Her smoking use included cigarettes. She has a 20.00 pack-year smoking history. She has never used smokeless tobacco. She reports previous alcohol use. She reports that she does not use drugs.  Past Medical History:  Diagnosis Date  . Anxiety   . Atrophic vaginitis   . HTN (hypertension) 2010  . Hyperlipidemia 08/2011  . Mitral valve disorders(424.0) 04/07/2009   now Dr. Graciela Husbands feels this was a misdiagnosed problem  . Uterine prolapse 09/2009   use of pessary dish 3 "    Past Surgical History:  Procedure Laterality Date  . CATARACT EXTRACTION, BILATERAL     2018  . COLONOSCOPY  01/25/12   polyp recheck 3-5 years  . SKIN BIOPSY     brown spot, neg per patient  . TONSILLECTOMY AND ADENOIDECTOMY  age 63  . TUBAL LIGATION  30's  . WISDOM TOOTH EXTRACTION  mid 20's    Current Outpatient Medications  Medication Sig Dispense Refill  . amLODipine (NORVASC) 5 MG tablet TAKE 1 TABLET BY MOUTH EVERY DAY 30 tablet 6  . Calcium Citrate-Vitamin D (CALCIUM + D PO) Take by mouth.    . conjugated estrogens (PREMARIN) vaginal cream Use 1/2 g vaginally 2 times weekly with pessary use 42.5 g 0  . donepezil (ARICEPT) 10 MG tablet 5 mg.   3   No current facility-administered medications for this visit.    Family History  Problem Relation Age of  Onset  . Hypertension Mother   . Diverticulitis Mother   . Heart disease Father   . Hypertension Father   . Rheum arthritis Sister   . Diverticulitis Sister   . Coronary artery disease Other     Review of Systems  Exam:   LMP 07/04/1992 (Approximate)     General appearance: alert, cooperative and appears stated age Head: normocephalic, without obvious abnormality, atraumatic Neck: no adenopathy, supple, symmetrical, trachea midline and thyroid normal to inspection and palpation Lungs: clear to auscultation bilaterally Breasts: normal appearance, no masses or tenderness, No nipple retraction or dimpling, No nipple discharge or bleeding, No axillary adenopathy Heart: regular rate and rhythm Abdomen: soft, non-tender; no masses, no organomegaly Extremities: extremities normal, atraumatic, no cyanosis or edema Skin: skin color, texture, turgor normal. No rashes or lesions Lymph nodes: cervical, supraclavicular, and axillary nodes normal. Neurologic: grossly normal  Pelvic: External genitalia:  no lesions              No abnormal inguinal nodes palpated.              Urethra:  normal appearing urethra with no masses, tenderness or lesions              Bartholins and Skenes: normal                 Vagina:  normal appearing vagina with normal color and discharge, no lesions              Cervix: no lesions              Pap taken: {yes no:314532} Bimanual Exam:  Uterus:  normal size, contour, position, consistency, mobility, non-tender              Adnexa: no mass, fullness, tenderness              Rectal exam: {yes no:314532}.  Confirms.              Anus:  normal sphincter tone, no lesions  Chaperone was present for exam.  Assessment:   Well woman visit with normal exam.   Plan: Mammogram screening discussed. Self breast awareness reviewed. Pap and HR HPV as above. Guidelines for Calcium, Vitamin D, regular exercise program including cardiovascular and weight bearing  exercise.   Follow up annually and prn.   Additional counseling given.  {yes [id number]. _______ minutes face to face time of which over 50% was spent in counseling.    After visit summary provided.

## 2020-08-18 DIAGNOSIS — R300 Dysuria: Secondary | ICD-10-CM | POA: Diagnosis not present

## 2020-10-18 DIAGNOSIS — R300 Dysuria: Secondary | ICD-10-CM | POA: Diagnosis not present

## 2020-10-18 DIAGNOSIS — R309 Painful micturition, unspecified: Secondary | ICD-10-CM | POA: Diagnosis not present

## 2020-11-24 DIAGNOSIS — K5900 Constipation, unspecified: Secondary | ICD-10-CM | POA: Diagnosis not present

## 2020-11-24 DIAGNOSIS — I10 Essential (primary) hypertension: Secondary | ICD-10-CM | POA: Diagnosis not present

## 2020-11-24 DIAGNOSIS — E783 Hyperchylomicronemia: Secondary | ICD-10-CM | POA: Diagnosis not present

## 2020-11-24 DIAGNOSIS — R300 Dysuria: Secondary | ICD-10-CM | POA: Diagnosis not present

## 2020-12-01 DIAGNOSIS — I10 Essential (primary) hypertension: Secondary | ICD-10-CM | POA: Diagnosis not present

## 2020-12-01 DIAGNOSIS — G309 Alzheimer's disease, unspecified: Secondary | ICD-10-CM | POA: Diagnosis not present

## 2020-12-09 DIAGNOSIS — E783 Hyperchylomicronemia: Secondary | ICD-10-CM | POA: Diagnosis not present

## 2020-12-09 DIAGNOSIS — Z79899 Other long term (current) drug therapy: Secondary | ICD-10-CM | POA: Diagnosis not present

## 2020-12-09 DIAGNOSIS — I10 Essential (primary) hypertension: Secondary | ICD-10-CM | POA: Diagnosis not present

## 2020-12-23 ENCOUNTER — Emergency Department (HOSPITAL_COMMUNITY)
Admission: EM | Admit: 2020-12-23 | Discharge: 2020-12-23 | Disposition: A | Discharge status: Home / Self Care | Payer: Medicare Other | Attending: Emergency Medicine | Admitting: Emergency Medicine

## 2020-12-23 ENCOUNTER — Encounter (HOSPITAL_COMMUNITY): Payer: Self-pay | Admitting: Emergency Medicine

## 2020-12-23 DIAGNOSIS — I10 Essential (primary) hypertension: Secondary | ICD-10-CM | POA: Insufficient documentation

## 2020-12-23 DIAGNOSIS — T675XXA Heat exhaustion, unspecified, initial encounter: Secondary | ICD-10-CM | POA: Diagnosis not present

## 2020-12-23 DIAGNOSIS — M549 Dorsalgia, unspecified: Secondary | ICD-10-CM | POA: Diagnosis not present

## 2020-12-23 DIAGNOSIS — R630 Anorexia: Secondary | ICD-10-CM | POA: Diagnosis present

## 2020-12-23 DIAGNOSIS — T679XXA Effect of heat and light, unspecified, initial encounter: Secondary | ICD-10-CM

## 2020-12-23 DIAGNOSIS — Z87891 Personal history of nicotine dependence: Secondary | ICD-10-CM | POA: Insufficient documentation

## 2020-12-23 DIAGNOSIS — E860 Dehydration: Secondary | ICD-10-CM | POA: Diagnosis not present

## 2020-12-23 DIAGNOSIS — R6889 Other general symptoms and signs: Secondary | ICD-10-CM | POA: Diagnosis not present

## 2020-12-23 DIAGNOSIS — R0902 Hypoxemia: Secondary | ICD-10-CM | POA: Diagnosis not present

## 2020-12-23 DIAGNOSIS — R531 Weakness: Secondary | ICD-10-CM | POA: Diagnosis not present

## 2020-12-23 DIAGNOSIS — Z79899 Other long term (current) drug therapy: Secondary | ICD-10-CM | POA: Diagnosis not present

## 2020-12-23 DIAGNOSIS — Z743 Need for continuous supervision: Secondary | ICD-10-CM | POA: Diagnosis not present

## 2020-12-23 DIAGNOSIS — R404 Transient alteration of awareness: Secondary | ICD-10-CM | POA: Diagnosis not present

## 2020-12-23 LAB — CBC WITH DIFFERENTIAL/PLATELET
Abs Immature Granulocytes: 0.02 10*3/uL (ref 0.00–0.07)
Basophils Absolute: 0 10*3/uL (ref 0.0–0.1)
Basophils Relative: 0 %
Eosinophils Absolute: 0.1 10*3/uL (ref 0.0–0.5)
Eosinophils Relative: 1 %
HCT: 39.7 % (ref 36.0–46.0)
Hemoglobin: 13.1 g/dL (ref 12.0–15.0)
Immature Granulocytes: 0 %
Lymphocytes Relative: 16 %
Lymphs Abs: 1.1 10*3/uL (ref 0.7–4.0)
MCH: 32 pg (ref 26.0–34.0)
MCHC: 33 g/dL (ref 30.0–36.0)
MCV: 96.8 fL (ref 80.0–100.0)
Monocytes Absolute: 0.7 10*3/uL (ref 0.1–1.0)
Monocytes Relative: 10 %
Neutro Abs: 4.9 10*3/uL (ref 1.7–7.7)
Neutrophils Relative %: 73 %
Platelets: 203 10*3/uL (ref 150–400)
RBC: 4.1 MIL/uL (ref 3.87–5.11)
RDW: 13.1 % (ref 11.5–15.5)
WBC: 6.8 10*3/uL (ref 4.0–10.5)
nRBC: 0 % (ref 0.0–0.2)

## 2020-12-23 LAB — BASIC METABOLIC PANEL
Anion gap: 6 (ref 5–15)
BUN: 25 mg/dL — ABNORMAL HIGH (ref 8–23)
CO2: 29 mmol/L (ref 22–32)
Calcium: 9 mg/dL (ref 8.9–10.3)
Chloride: 106 mmol/L (ref 98–111)
Creatinine, Ser: 1.04 mg/dL — ABNORMAL HIGH (ref 0.44–1.00)
GFR, Estimated: 55 mL/min — ABNORMAL LOW (ref >60)
Glucose, Bld: 118 mg/dL — ABNORMAL HIGH (ref 70–99)
Potassium: 4.3 mmol/L (ref 3.5–5.1)
Sodium: 141 mmol/L (ref 135–145)

## 2020-12-23 NOTE — Discharge Instructions (Addendum)
Her blood counts, metabolic panel and EKG are reassuring today.  Please ensure she stays hydrated. Follow with her primary care.

## 2020-12-23 NOTE — ED Provider Notes (Signed)
[facility]-EMERGENCY DEPT Provider Note   CSN: [id number] Arrival date & time: 12/23/20  1426     History Chief Complaint  Patient presents with   possible heat exposure    Shari Kelly is a 80 y.o. female past medical history of hypertension, hyperlipidemia, dementia, brought into the ED from [facility] nursing home for evaluation.  It is reported that patient was outside for no longer than 10 minutes making tie-dyed T-shirts when she began feeling generalized weakness and complained of some back pain.  They thought that heat had caused this and they were concerned about possible dehydration.  She did not complain of any chest pain or shortness of breath.  She did not complain of any abdominal pain, she was not vomiting.  Has had over some time now gradual decrease in p.o. intake which they wonder if this is contributing.  She is at her mental baseline dementia.  Not on blood thinners.  The history is provided by the nursing home. The history is limited by the condition of the patient.      Past Medical History:  Diagnosis Date   Anxiety    Atrophic vaginitis    HTN (hypertension) 2010   Hyperlipidemia 08/2011   Mitral valve disorders(424.0) 04/07/2009   now Dr. Graciela Husbands feels this was a misdiagnosed problem   Uterine prolapse 09/2009   use of pessary dish 3 "    Patient Active Problem List   Diagnosis Date Noted   Memory change 06/06/2018    Class: History of   HTN (hypertension)    MITRAL VALVE DISORDERS 04/07/2009    Past Surgical History:  Procedure Laterality Date   CATARACT EXTRACTION, BILATERAL     2018   COLONOSCOPY  01/25/12   polyp recheck 3-5 years   SKIN BIOPSY     brown spot, neg per patient   TONSILLECTOMY AND ADENOIDECTOMY  age 13   TUBAL LIGATION  30's   WISDOM TOOTH EXTRACTION  mid 20's     OB History     Gravida  3   Para  2   Term  0   Preterm  0   AB  1   Living  2      SAB  1   IAB  0   Ectopic  0    Multiple  0   Live Births  2           Family History  Problem Relation Age of Onset   Hypertension Mother    Diverticulitis Mother    Heart disease Father    Hypertension Father    Rheum arthritis Sister    Diverticulitis Sister    Coronary artery disease Other     Social History   Tobacco Use   Smoking status: Former    Packs/day: 0.50    Years: 40.00    Pack years: 20.00    Types: Cigarettes    Quit date: 06/21/2006    Years since quitting: 14.5   Smokeless tobacco: Never  Substance Use Topics   Alcohol use: Not Currently   Drug use: No    Home Medications Prior to Admission medications   Medication Sig Start Date End Date Taking? Authorizing Provider  amLODipine (NORVASC) 5 MG tablet TAKE 1 TABLET BY MOUTH EVERY DAY 03/19/14   Duke Salvia, MD  Calcium Citrate-Vitamin D (CALCIUM + D PO) Take by mouth.    [provider]  conjugated estrogens (PREMARIN) vaginal cream Use  1/2 g vaginally 2 times weekly with pessary use 05/08/19   Verner Chol, CNM  donepezil (ARICEPT) 10 MG tablet 5 mg.  12/19/17   [provider]    Allergies    Penicillins  Review of Systems   Review of Systems  Unable to perform ROS: Dementia   Physical Exam Updated Vital Signs BP (!) 145/67 (BP Location: Right Arm)   Pulse 66   Temp 98.1 F (36.7 C) (Oral)   Resp 18   LMP 07/04/1992 (Approximate)   SpO2 98%   Physical Exam Vitals and nursing note reviewed.  Constitutional:      General: She is not in acute distress.    Appearance: She is well-developed. She is not ill-appearing.  HENT:     Head: Normocephalic and atraumatic.  Eyes:     Conjunctiva/sclera: Conjunctivae normal.  Cardiovascular:     Rate and Rhythm: Normal rate and regular rhythm.  Pulmonary:     Effort: Pulmonary effort is normal. No respiratory distress.     Breath sounds: Normal breath sounds.  Abdominal:     General: Bowel sounds are normal.     Palpations: Abdomen is  soft.     Tenderness: There is no abdominal tenderness.  Musculoskeletal:     Right lower leg: No edema.     Left lower leg: No edema.  Skin:    General: Skin is warm.  Neurological:     Mental Status: She is alert.     Comments: Speech is fluent.  No obvious facial droop.  Following simple commands without difficulty.  Equal grip strength bilateral upper extremities.  Spontaneously moving all 4 extremities.  Normal tone.  She is oriented to person.  Psychiatric:        Behavior: Behavior normal.    ED Results / Procedures / Treatments   Labs (all labs ordered are listed, but only abnormal results are displayed) Labs Reviewed  BASIC METABOLIC PANEL - Abnormal; Notable for the following components:      Result Value   Glucose, Bld 118 (*)    BUN 25 (*)    Creatinine, Ser 1.04 (*)    GFR, Estimated 55 (*)    All other components within normal limits  CBC WITH DIFFERENTIAL/PLATELET    EKG None  Radiology No results found.  Procedures Procedures   Medications Ordered in ED Medications - No data to display  ED Course  I have reviewed the triage vital signs and the nursing notes.  Pertinent labs & imaging results that were available during my care of the patient were reviewed by me and considered in my medical decision making (see chart for details).     MDM Rules/Calculators/A&P                          Patient is 80 year old female brought in from nursing home after she got overheated outside while making tie-dyed T-shirts.  She began complaining of some weakness and pain in her back.  Nursing staff reports she may have been bending over a table to work on the shirts.  She was never complaining of chest pain.  She has no active complaints here and is remained at her mental baseline.  Vital signs are stable.  Heart and lung sounds are clear.  Blood work reveals baseline electrolytes, no anemia or leukocytosis.  EKG is normal sinus rhythm without evidence of ischemia.   Patient did not complain of any chest pains  nor she complaining of chest pains now.  Do not feel that troponin is necessary.  Patient was discussed with and evaluated by attending physician Dr. Rhunette Croft who is in agreement with work up and care plan for discharge.  Discussed results, findings, treatment and follow up. Patient advised of return precautions. Patient verbalized understanding and agreed with plan.   Final Clinical Impression(s) / ED Diagnoses Final diagnoses:  Heat exposure, initial encounter    Rx / DC Orders ED Discharge Orders     None        Joh Rao, [place] N, PA-C 12/23/20 1801    Derwood Kaplan, MD 12/24/20 1614

## 2020-12-23 NOTE — ED Notes (Signed)
[facility] called for transport.  

## 2020-12-23 NOTE — ED Notes (Signed)
Patient resting comfortably with eyes closed. Equal chest rise and fall noted. 

## 2020-12-23 NOTE — ED Triage Notes (Addendum)
Per EMS-patient was outside making tie dyed T-shirts for 10 minutes when staff states she got over heated-no S/S's of heat exposure-patient has no complaints-patient is from [facility]

## 2020-12-23 NOTE — ED Notes (Signed)
Patient depend changed. Warm blanket provided.

## 2020-12-23 NOTE — ED Notes (Signed)
Patient assisted to restroom using wheelchair. Patient depend changed. Assisted back into recliner.

## 2020-12-23 NOTE — ED Provider Notes (Signed)
Emergency Medicine Provider Triage Evaluation Note  Shari Kelly 80 y.o. female was evaluated in triage.  Patient brought by EMS from nursing home for concerns of dehydration.  Patient was outside making tye dye T-shirts.  She was outside doing this for about 10 minutes.  Patient states she felt tired.  Nursing home staff called EMS for evaluation of dehydration.  Patient does not any complaints at this time.  Denies any chest pain, difficulty breathing, abdominal pain.  Patient with history of dementia. She is at baseline    Review of Systems  Positive: N/a Negative: N/a  Physical Exam  BP 134/82   Pulse 70   Temp 98.2 F (36.8 C) (Oral)   Resp 18   Ht 5\' 4"  (1.626 m)   Wt 65.8 kg   SpO2 100%   BMI 24.89 kg/m  Gen:   Awake, no distress   HEENT:  Atraumatic  Resp:  Normal effort. CTAB Cardiac:  Normal rate  Abd:   Nondistended, nontender  MSK:   Moves extremities without difficulty  Neuro:  Speech clear   Other:      Medical Decision Making  Medically screening exam initiated at 2:33 PM  Appropriate orders placed.  Shari Kelly was informed that the remainder of the evaluation will be completed by another provider, this initial triage assessment does not replace that evaluation. They are counseled that they will need to remain in the ED until the completion of their workup, including full H&P and results of any tests.  Risks of leaving the emergency department prior to completion of treatment were discussed. Patient was advised to inform ED staff if they are leaving before their treatment is complete. The patient acknowledged these risks and time was allowed for questions.     The patient appears stable so that the remainder of the MSE may be completed by another provider.    Clinical Impression  Dehydration   Portions of this note were generated with Dragon dictation software. Dictation errors may occur despite best attempts at proofreading.     Milbert Coulter, PA-C 12/23/20 1435    12/25/20, MD 12/23/20 [phone]

## 2020-12-29 DIAGNOSIS — K5901 Slow transit constipation: Secondary | ICD-10-CM | POA: Diagnosis not present

## 2020-12-29 DIAGNOSIS — G301 Alzheimer's disease with late onset: Secondary | ICD-10-CM | POA: Diagnosis not present

## 2020-12-29 DIAGNOSIS — I10 Essential (primary) hypertension: Secondary | ICD-10-CM | POA: Diagnosis not present

## 2021-01-19 DIAGNOSIS — Z79899 Other long term (current) drug therapy: Secondary | ICD-10-CM | POA: Diagnosis not present

## 2021-01-19 DIAGNOSIS — I10 Essential (primary) hypertension: Secondary | ICD-10-CM | POA: Diagnosis not present

## 2021-01-19 DIAGNOSIS — G301 Alzheimer's disease with late onset: Secondary | ICD-10-CM | POA: Diagnosis not present

## 2021-02-02 DIAGNOSIS — I10 Essential (primary) hypertension: Secondary | ICD-10-CM | POA: Diagnosis not present

## 2021-02-02 DIAGNOSIS — G301 Alzheimer's disease with late onset: Secondary | ICD-10-CM | POA: Diagnosis not present

## 2021-02-02 DIAGNOSIS — Z79899 Other long term (current) drug therapy: Secondary | ICD-10-CM | POA: Diagnosis not present

## 2021-02-03 DIAGNOSIS — R488 Other symbolic dysfunctions: Secondary | ICD-10-CM | POA: Diagnosis not present

## 2021-02-03 DIAGNOSIS — M6281 Muscle weakness (generalized): Secondary | ICD-10-CM | POA: Diagnosis not present

## 2021-02-03 DIAGNOSIS — R4701 Aphasia: Secondary | ICD-10-CM | POA: Diagnosis not present

## 2021-02-03 DIAGNOSIS — R2681 Unsteadiness on feet: Secondary | ICD-10-CM | POA: Diagnosis not present

## 2021-02-08 DIAGNOSIS — R4701 Aphasia: Secondary | ICD-10-CM | POA: Diagnosis not present

## 2021-02-08 DIAGNOSIS — R2689 Other abnormalities of gait and mobility: Secondary | ICD-10-CM | POA: Diagnosis not present

## 2021-02-08 DIAGNOSIS — M6281 Muscle weakness (generalized): Secondary | ICD-10-CM | POA: Diagnosis not present

## 2021-02-08 DIAGNOSIS — R2681 Unsteadiness on feet: Secondary | ICD-10-CM | POA: Diagnosis not present

## 2021-02-08 DIAGNOSIS — R488 Other symbolic dysfunctions: Secondary | ICD-10-CM | POA: Diagnosis not present

## 2021-02-09 DIAGNOSIS — M6281 Muscle weakness (generalized): Secondary | ICD-10-CM | POA: Diagnosis not present

## 2021-02-09 DIAGNOSIS — R488 Other symbolic dysfunctions: Secondary | ICD-10-CM | POA: Diagnosis not present

## 2021-02-09 DIAGNOSIS — R2681 Unsteadiness on feet: Secondary | ICD-10-CM | POA: Diagnosis not present

## 2021-02-10 DIAGNOSIS — R4701 Aphasia: Secondary | ICD-10-CM | POA: Diagnosis not present

## 2021-02-10 DIAGNOSIS — R2681 Unsteadiness on feet: Secondary | ICD-10-CM | POA: Diagnosis not present

## 2021-02-10 DIAGNOSIS — R488 Other symbolic dysfunctions: Secondary | ICD-10-CM | POA: Diagnosis not present

## 2021-02-10 DIAGNOSIS — M6281 Muscle weakness (generalized): Secondary | ICD-10-CM | POA: Diagnosis not present

## 2021-02-10 DIAGNOSIS — R2689 Other abnormalities of gait and mobility: Secondary | ICD-10-CM | POA: Diagnosis not present

## 2021-02-11 DIAGNOSIS — R2681 Unsteadiness on feet: Secondary | ICD-10-CM | POA: Diagnosis not present

## 2021-02-11 DIAGNOSIS — M6281 Muscle weakness (generalized): Secondary | ICD-10-CM | POA: Diagnosis not present

## 2021-02-11 DIAGNOSIS — R4701 Aphasia: Secondary | ICD-10-CM | POA: Diagnosis not present

## 2021-02-11 DIAGNOSIS — R488 Other symbolic dysfunctions: Secondary | ICD-10-CM | POA: Diagnosis not present

## 2021-02-12 DIAGNOSIS — M6281 Muscle weakness (generalized): Secondary | ICD-10-CM | POA: Diagnosis not present

## 2021-02-12 DIAGNOSIS — R488 Other symbolic dysfunctions: Secondary | ICD-10-CM | POA: Diagnosis not present

## 2021-02-12 DIAGNOSIS — R4701 Aphasia: Secondary | ICD-10-CM | POA: Diagnosis not present

## 2021-02-12 DIAGNOSIS — R2689 Other abnormalities of gait and mobility: Secondary | ICD-10-CM | POA: Diagnosis not present

## 2021-02-12 DIAGNOSIS — R2681 Unsteadiness on feet: Secondary | ICD-10-CM | POA: Diagnosis not present

## 2021-02-15 DIAGNOSIS — R488 Other symbolic dysfunctions: Secondary | ICD-10-CM | POA: Diagnosis not present

## 2021-02-15 DIAGNOSIS — R4701 Aphasia: Secondary | ICD-10-CM | POA: Diagnosis not present

## 2021-02-15 DIAGNOSIS — R2689 Other abnormalities of gait and mobility: Secondary | ICD-10-CM | POA: Diagnosis not present

## 2021-02-15 DIAGNOSIS — R2681 Unsteadiness on feet: Secondary | ICD-10-CM | POA: Diagnosis not present

## 2021-02-15 DIAGNOSIS — M6281 Muscle weakness (generalized): Secondary | ICD-10-CM | POA: Diagnosis not present

## 2021-02-16 DIAGNOSIS — R2681 Unsteadiness on feet: Secondary | ICD-10-CM | POA: Diagnosis not present

## 2021-02-16 DIAGNOSIS — R488 Other symbolic dysfunctions: Secondary | ICD-10-CM | POA: Diagnosis not present

## 2021-02-16 DIAGNOSIS — R4701 Aphasia: Secondary | ICD-10-CM | POA: Diagnosis not present

## 2021-02-17 DIAGNOSIS — R2681 Unsteadiness on feet: Secondary | ICD-10-CM | POA: Diagnosis not present

## 2021-02-17 DIAGNOSIS — R488 Other symbolic dysfunctions: Secondary | ICD-10-CM | POA: Diagnosis not present

## 2021-02-17 DIAGNOSIS — M6281 Muscle weakness (generalized): Secondary | ICD-10-CM | POA: Diagnosis not present

## 2021-02-17 DIAGNOSIS — R2689 Other abnormalities of gait and mobility: Secondary | ICD-10-CM | POA: Diagnosis not present

## 2021-02-18 DIAGNOSIS — M6281 Muscle weakness (generalized): Secondary | ICD-10-CM | POA: Diagnosis not present

## 2021-02-18 DIAGNOSIS — R2689 Other abnormalities of gait and mobility: Secondary | ICD-10-CM | POA: Diagnosis not present

## 2021-02-18 DIAGNOSIS — R488 Other symbolic dysfunctions: Secondary | ICD-10-CM | POA: Diagnosis not present

## 2021-02-18 DIAGNOSIS — R4701 Aphasia: Secondary | ICD-10-CM | POA: Diagnosis not present

## 2021-02-18 DIAGNOSIS — R2681 Unsteadiness on feet: Secondary | ICD-10-CM | POA: Diagnosis not present

## 2021-02-19 DIAGNOSIS — R2689 Other abnormalities of gait and mobility: Secondary | ICD-10-CM | POA: Diagnosis not present

## 2021-02-19 DIAGNOSIS — R4701 Aphasia: Secondary | ICD-10-CM | POA: Diagnosis not present

## 2021-02-19 DIAGNOSIS — R2681 Unsteadiness on feet: Secondary | ICD-10-CM | POA: Diagnosis not present

## 2021-02-19 DIAGNOSIS — R488 Other symbolic dysfunctions: Secondary | ICD-10-CM | POA: Diagnosis not present

## 2021-02-19 DIAGNOSIS — M6281 Muscle weakness (generalized): Secondary | ICD-10-CM | POA: Diagnosis not present

## 2021-02-22 DIAGNOSIS — R2689 Other abnormalities of gait and mobility: Secondary | ICD-10-CM | POA: Diagnosis not present

## 2021-02-22 DIAGNOSIS — M6281 Muscle weakness (generalized): Secondary | ICD-10-CM | POA: Diagnosis not present

## 2021-02-22 DIAGNOSIS — R488 Other symbolic dysfunctions: Secondary | ICD-10-CM | POA: Diagnosis not present

## 2021-02-22 DIAGNOSIS — R2681 Unsteadiness on feet: Secondary | ICD-10-CM | POA: Diagnosis not present

## 2021-02-23 DIAGNOSIS — Z79899 Other long term (current) drug therapy: Secondary | ICD-10-CM | POA: Diagnosis not present

## 2021-02-23 DIAGNOSIS — G301 Alzheimer's disease with late onset: Secondary | ICD-10-CM | POA: Diagnosis not present

## 2021-02-23 DIAGNOSIS — R488 Other symbolic dysfunctions: Secondary | ICD-10-CM | POA: Diagnosis not present

## 2021-02-23 DIAGNOSIS — I10 Essential (primary) hypertension: Secondary | ICD-10-CM | POA: Diagnosis not present

## 2021-02-23 DIAGNOSIS — R4701 Aphasia: Secondary | ICD-10-CM | POA: Diagnosis not present

## 2021-02-23 DIAGNOSIS — R2681 Unsteadiness on feet: Secondary | ICD-10-CM | POA: Diagnosis not present

## 2021-02-23 DIAGNOSIS — M6281 Muscle weakness (generalized): Secondary | ICD-10-CM | POA: Diagnosis not present

## 2021-02-24 DIAGNOSIS — R2681 Unsteadiness on feet: Secondary | ICD-10-CM | POA: Diagnosis not present

## 2021-02-24 DIAGNOSIS — R488 Other symbolic dysfunctions: Secondary | ICD-10-CM | POA: Diagnosis not present

## 2021-02-24 DIAGNOSIS — R4701 Aphasia: Secondary | ICD-10-CM | POA: Diagnosis not present

## 2021-02-24 DIAGNOSIS — M6281 Muscle weakness (generalized): Secondary | ICD-10-CM | POA: Diagnosis not present

## 2021-02-24 DIAGNOSIS — R2689 Other abnormalities of gait and mobility: Secondary | ICD-10-CM | POA: Diagnosis not present

## 2021-02-25 DIAGNOSIS — R2689 Other abnormalities of gait and mobility: Secondary | ICD-10-CM | POA: Diagnosis not present

## 2021-02-25 DIAGNOSIS — M6281 Muscle weakness (generalized): Secondary | ICD-10-CM | POA: Diagnosis not present

## 2021-02-25 DIAGNOSIS — R2681 Unsteadiness on feet: Secondary | ICD-10-CM | POA: Diagnosis not present

## 2021-02-25 DIAGNOSIS — R4701 Aphasia: Secondary | ICD-10-CM | POA: Diagnosis not present

## 2021-02-25 DIAGNOSIS — R488 Other symbolic dysfunctions: Secondary | ICD-10-CM | POA: Diagnosis not present

## 2021-03-01 DIAGNOSIS — B351 Tinea unguium: Secondary | ICD-10-CM | POA: Diagnosis not present

## 2021-03-01 DIAGNOSIS — R4701 Aphasia: Secondary | ICD-10-CM | POA: Diagnosis not present

## 2021-03-01 DIAGNOSIS — I739 Peripheral vascular disease, unspecified: Secondary | ICD-10-CM | POA: Diagnosis not present

## 2021-03-01 DIAGNOSIS — R2689 Other abnormalities of gait and mobility: Secondary | ICD-10-CM | POA: Diagnosis not present

## 2021-03-01 DIAGNOSIS — R488 Other symbolic dysfunctions: Secondary | ICD-10-CM | POA: Diagnosis not present

## 2021-03-01 DIAGNOSIS — R2681 Unsteadiness on feet: Secondary | ICD-10-CM | POA: Diagnosis not present

## 2021-03-01 DIAGNOSIS — M6281 Muscle weakness (generalized): Secondary | ICD-10-CM | POA: Diagnosis not present

## 2021-03-02 DIAGNOSIS — M6281 Muscle weakness (generalized): Secondary | ICD-10-CM | POA: Diagnosis not present

## 2021-03-03 DIAGNOSIS — R488 Other symbolic dysfunctions: Secondary | ICD-10-CM | POA: Diagnosis not present

## 2021-03-03 DIAGNOSIS — R2681 Unsteadiness on feet: Secondary | ICD-10-CM | POA: Diagnosis not present

## 2021-03-03 DIAGNOSIS — R2689 Other abnormalities of gait and mobility: Secondary | ICD-10-CM | POA: Diagnosis not present

## 2021-03-03 DIAGNOSIS — R4701 Aphasia: Secondary | ICD-10-CM | POA: Diagnosis not present

## 2021-03-03 DIAGNOSIS — M6281 Muscle weakness (generalized): Secondary | ICD-10-CM | POA: Diagnosis not present

## 2021-03-04 DIAGNOSIS — R2681 Unsteadiness on feet: Secondary | ICD-10-CM | POA: Diagnosis not present

## 2021-03-04 DIAGNOSIS — R4701 Aphasia: Secondary | ICD-10-CM | POA: Diagnosis not present

## 2021-03-04 DIAGNOSIS — M6281 Muscle weakness (generalized): Secondary | ICD-10-CM | POA: Diagnosis not present

## 2021-03-04 DIAGNOSIS — R488 Other symbolic dysfunctions: Secondary | ICD-10-CM | POA: Diagnosis not present

## 2021-03-05 DIAGNOSIS — R2689 Other abnormalities of gait and mobility: Secondary | ICD-10-CM | POA: Diagnosis not present

## 2021-03-05 DIAGNOSIS — M6281 Muscle weakness (generalized): Secondary | ICD-10-CM | POA: Diagnosis not present

## 2021-03-05 DIAGNOSIS — R488 Other symbolic dysfunctions: Secondary | ICD-10-CM | POA: Diagnosis not present

## 2021-03-05 DIAGNOSIS — R2681 Unsteadiness on feet: Secondary | ICD-10-CM | POA: Diagnosis not present

## 2021-03-09 DIAGNOSIS — R488 Other symbolic dysfunctions: Secondary | ICD-10-CM | POA: Diagnosis not present

## 2021-03-09 DIAGNOSIS — R2689 Other abnormalities of gait and mobility: Secondary | ICD-10-CM | POA: Diagnosis not present

## 2021-03-09 DIAGNOSIS — R4701 Aphasia: Secondary | ICD-10-CM | POA: Diagnosis not present

## 2021-03-09 DIAGNOSIS — R2681 Unsteadiness on feet: Secondary | ICD-10-CM | POA: Diagnosis not present

## 2021-03-09 DIAGNOSIS — M6281 Muscle weakness (generalized): Secondary | ICD-10-CM | POA: Diagnosis not present

## 2021-03-10 DIAGNOSIS — M6281 Muscle weakness (generalized): Secondary | ICD-10-CM | POA: Diagnosis not present

## 2021-03-10 DIAGNOSIS — R4701 Aphasia: Secondary | ICD-10-CM | POA: Diagnosis not present

## 2021-03-10 DIAGNOSIS — R488 Other symbolic dysfunctions: Secondary | ICD-10-CM | POA: Diagnosis not present

## 2021-03-10 DIAGNOSIS — R2681 Unsteadiness on feet: Secondary | ICD-10-CM | POA: Diagnosis not present

## 2021-03-11 DIAGNOSIS — R4701 Aphasia: Secondary | ICD-10-CM | POA: Diagnosis not present

## 2021-03-11 DIAGNOSIS — M6281 Muscle weakness (generalized): Secondary | ICD-10-CM | POA: Diagnosis not present

## 2021-03-11 DIAGNOSIS — R2681 Unsteadiness on feet: Secondary | ICD-10-CM | POA: Diagnosis not present

## 2021-03-11 DIAGNOSIS — R488 Other symbolic dysfunctions: Secondary | ICD-10-CM | POA: Diagnosis not present

## 2021-03-12 DIAGNOSIS — R4701 Aphasia: Secondary | ICD-10-CM | POA: Diagnosis not present

## 2021-03-12 DIAGNOSIS — R2681 Unsteadiness on feet: Secondary | ICD-10-CM | POA: Diagnosis not present

## 2021-03-12 DIAGNOSIS — R488 Other symbolic dysfunctions: Secondary | ICD-10-CM | POA: Diagnosis not present

## 2021-03-15 DIAGNOSIS — R4701 Aphasia: Secondary | ICD-10-CM | POA: Diagnosis not present

## 2021-03-15 DIAGNOSIS — M6281 Muscle weakness (generalized): Secondary | ICD-10-CM | POA: Diagnosis not present

## 2021-03-15 DIAGNOSIS — R488 Other symbolic dysfunctions: Secondary | ICD-10-CM | POA: Diagnosis not present

## 2021-03-15 DIAGNOSIS — R2681 Unsteadiness on feet: Secondary | ICD-10-CM | POA: Diagnosis not present

## 2021-03-15 DIAGNOSIS — R2689 Other abnormalities of gait and mobility: Secondary | ICD-10-CM | POA: Diagnosis not present

## 2021-03-16 DIAGNOSIS — M6281 Muscle weakness (generalized): Secondary | ICD-10-CM | POA: Diagnosis not present

## 2021-03-16 DIAGNOSIS — R4701 Aphasia: Secondary | ICD-10-CM | POA: Diagnosis not present

## 2021-03-16 DIAGNOSIS — R488 Other symbolic dysfunctions: Secondary | ICD-10-CM | POA: Diagnosis not present

## 2021-03-16 DIAGNOSIS — R2681 Unsteadiness on feet: Secondary | ICD-10-CM | POA: Diagnosis not present

## 2021-03-16 DIAGNOSIS — R2689 Other abnormalities of gait and mobility: Secondary | ICD-10-CM | POA: Diagnosis not present

## 2021-03-17 DIAGNOSIS — R2689 Other abnormalities of gait and mobility: Secondary | ICD-10-CM | POA: Diagnosis not present

## 2021-03-17 DIAGNOSIS — R488 Other symbolic dysfunctions: Secondary | ICD-10-CM | POA: Diagnosis not present

## 2021-03-17 DIAGNOSIS — R4701 Aphasia: Secondary | ICD-10-CM | POA: Diagnosis not present

## 2021-03-17 DIAGNOSIS — R2681 Unsteadiness on feet: Secondary | ICD-10-CM | POA: Diagnosis not present

## 2021-03-17 DIAGNOSIS — M6281 Muscle weakness (generalized): Secondary | ICD-10-CM | POA: Diagnosis not present

## 2021-03-18 DIAGNOSIS — R488 Other symbolic dysfunctions: Secondary | ICD-10-CM | POA: Diagnosis not present

## 2021-03-18 DIAGNOSIS — R4701 Aphasia: Secondary | ICD-10-CM | POA: Diagnosis not present

## 2021-03-18 DIAGNOSIS — R2681 Unsteadiness on feet: Secondary | ICD-10-CM | POA: Diagnosis not present

## 2021-03-19 DIAGNOSIS — M6281 Muscle weakness (generalized): Secondary | ICD-10-CM | POA: Diagnosis not present

## 2021-03-19 DIAGNOSIS — R4701 Aphasia: Secondary | ICD-10-CM | POA: Diagnosis not present

## 2021-03-19 DIAGNOSIS — R2681 Unsteadiness on feet: Secondary | ICD-10-CM | POA: Diagnosis not present

## 2021-03-19 DIAGNOSIS — R488 Other symbolic dysfunctions: Secondary | ICD-10-CM | POA: Diagnosis not present

## 2021-03-22 DIAGNOSIS — M6281 Muscle weakness (generalized): Secondary | ICD-10-CM | POA: Diagnosis not present

## 2021-03-22 DIAGNOSIS — R488 Other symbolic dysfunctions: Secondary | ICD-10-CM | POA: Diagnosis not present

## 2021-03-22 DIAGNOSIS — R4701 Aphasia: Secondary | ICD-10-CM | POA: Diagnosis not present

## 2021-03-22 DIAGNOSIS — R2681 Unsteadiness on feet: Secondary | ICD-10-CM | POA: Diagnosis not present

## 2021-03-23 DIAGNOSIS — G301 Alzheimer's disease with late onset: Secondary | ICD-10-CM | POA: Diagnosis not present

## 2021-03-23 DIAGNOSIS — R2681 Unsteadiness on feet: Secondary | ICD-10-CM | POA: Diagnosis not present

## 2021-03-23 DIAGNOSIS — R4701 Aphasia: Secondary | ICD-10-CM | POA: Diagnosis not present

## 2021-03-23 DIAGNOSIS — Z79899 Other long term (current) drug therapy: Secondary | ICD-10-CM | POA: Diagnosis not present

## 2021-03-23 DIAGNOSIS — R488 Other symbolic dysfunctions: Secondary | ICD-10-CM | POA: Diagnosis not present

## 2021-03-23 DIAGNOSIS — I10 Essential (primary) hypertension: Secondary | ICD-10-CM | POA: Diagnosis not present

## 2021-03-24 DIAGNOSIS — R4701 Aphasia: Secondary | ICD-10-CM | POA: Diagnosis not present

## 2021-03-25 DIAGNOSIS — R488 Other symbolic dysfunctions: Secondary | ICD-10-CM | POA: Diagnosis not present

## 2021-03-25 DIAGNOSIS — R2681 Unsteadiness on feet: Secondary | ICD-10-CM | POA: Diagnosis not present

## 2021-03-25 DIAGNOSIS — R2689 Other abnormalities of gait and mobility: Secondary | ICD-10-CM | POA: Diagnosis not present

## 2021-03-25 DIAGNOSIS — R4701 Aphasia: Secondary | ICD-10-CM | POA: Diagnosis not present

## 2021-03-25 DIAGNOSIS — M6281 Muscle weakness (generalized): Secondary | ICD-10-CM | POA: Diagnosis not present

## 2021-03-26 DIAGNOSIS — M6281 Muscle weakness (generalized): Secondary | ICD-10-CM | POA: Diagnosis not present

## 2021-03-26 DIAGNOSIS — R2689 Other abnormalities of gait and mobility: Secondary | ICD-10-CM | POA: Diagnosis not present

## 2021-03-26 DIAGNOSIS — R2681 Unsteadiness on feet: Secondary | ICD-10-CM | POA: Diagnosis not present

## 2021-03-26 DIAGNOSIS — R488 Other symbolic dysfunctions: Secondary | ICD-10-CM | POA: Diagnosis not present

## 2021-03-29 DIAGNOSIS — R488 Other symbolic dysfunctions: Secondary | ICD-10-CM | POA: Diagnosis not present

## 2021-03-29 DIAGNOSIS — R2681 Unsteadiness on feet: Secondary | ICD-10-CM | POA: Diagnosis not present

## 2021-03-29 DIAGNOSIS — R4701 Aphasia: Secondary | ICD-10-CM | POA: Diagnosis not present

## 2021-03-29 DIAGNOSIS — M6281 Muscle weakness (generalized): Secondary | ICD-10-CM | POA: Diagnosis not present

## 2021-03-30 DIAGNOSIS — R2681 Unsteadiness on feet: Secondary | ICD-10-CM | POA: Diagnosis not present

## 2021-03-30 DIAGNOSIS — R4701 Aphasia: Secondary | ICD-10-CM | POA: Diagnosis not present

## 2021-03-30 DIAGNOSIS — R488 Other symbolic dysfunctions: Secondary | ICD-10-CM | POA: Diagnosis not present

## 2021-03-31 DIAGNOSIS — R2681 Unsteadiness on feet: Secondary | ICD-10-CM | POA: Diagnosis not present

## 2021-03-31 DIAGNOSIS — R488 Other symbolic dysfunctions: Secondary | ICD-10-CM | POA: Diagnosis not present

## 2021-03-31 DIAGNOSIS — M6281 Muscle weakness (generalized): Secondary | ICD-10-CM | POA: Diagnosis not present

## 2021-03-31 DIAGNOSIS — R4701 Aphasia: Secondary | ICD-10-CM | POA: Diagnosis not present

## 2021-04-01 DIAGNOSIS — I10 Essential (primary) hypertension: Secondary | ICD-10-CM | POA: Diagnosis not present

## 2021-04-01 DIAGNOSIS — K5901 Slow transit constipation: Secondary | ICD-10-CM | POA: Diagnosis not present

## 2021-04-01 DIAGNOSIS — R4701 Aphasia: Secondary | ICD-10-CM | POA: Diagnosis not present

## 2021-04-01 DIAGNOSIS — G301 Alzheimer's disease with late onset: Secondary | ICD-10-CM | POA: Diagnosis not present

## 2021-04-01 DIAGNOSIS — R488 Other symbolic dysfunctions: Secondary | ICD-10-CM | POA: Diagnosis not present

## 2021-04-05 DIAGNOSIS — R4701 Aphasia: Secondary | ICD-10-CM | POA: Diagnosis not present

## 2021-04-05 DIAGNOSIS — R488 Other symbolic dysfunctions: Secondary | ICD-10-CM | POA: Diagnosis not present

## 2021-04-06 DIAGNOSIS — R4701 Aphasia: Secondary | ICD-10-CM | POA: Diagnosis not present

## 2021-04-06 DIAGNOSIS — R488 Other symbolic dysfunctions: Secondary | ICD-10-CM | POA: Diagnosis not present

## 2021-04-07 DIAGNOSIS — R4701 Aphasia: Secondary | ICD-10-CM | POA: Diagnosis not present

## 2021-04-07 DIAGNOSIS — R488 Other symbolic dysfunctions: Secondary | ICD-10-CM | POA: Diagnosis not present

## 2021-04-08 DIAGNOSIS — R4701 Aphasia: Secondary | ICD-10-CM | POA: Diagnosis not present

## 2021-04-08 DIAGNOSIS — R488 Other symbolic dysfunctions: Secondary | ICD-10-CM | POA: Diagnosis not present

## 2021-04-12 DIAGNOSIS — R488 Other symbolic dysfunctions: Secondary | ICD-10-CM | POA: Diagnosis not present

## 2021-04-12 DIAGNOSIS — R4701 Aphasia: Secondary | ICD-10-CM | POA: Diagnosis not present

## 2021-04-13 DIAGNOSIS — R488 Other symbolic dysfunctions: Secondary | ICD-10-CM | POA: Diagnosis not present

## 2021-04-13 DIAGNOSIS — R4701 Aphasia: Secondary | ICD-10-CM | POA: Diagnosis not present

## 2021-04-14 DIAGNOSIS — R488 Other symbolic dysfunctions: Secondary | ICD-10-CM | POA: Diagnosis not present

## 2021-04-14 DIAGNOSIS — R4701 Aphasia: Secondary | ICD-10-CM | POA: Diagnosis not present

## 2021-04-15 DIAGNOSIS — R488 Other symbolic dysfunctions: Secondary | ICD-10-CM | POA: Diagnosis not present

## 2021-04-15 DIAGNOSIS — R4701 Aphasia: Secondary | ICD-10-CM | POA: Diagnosis not present

## 2021-04-16 DIAGNOSIS — R488 Other symbolic dysfunctions: Secondary | ICD-10-CM | POA: Diagnosis not present

## 2021-04-16 DIAGNOSIS — R4701 Aphasia: Secondary | ICD-10-CM | POA: Diagnosis not present

## 2021-04-19 DIAGNOSIS — R488 Other symbolic dysfunctions: Secondary | ICD-10-CM | POA: Diagnosis not present

## 2021-04-19 DIAGNOSIS — R4701 Aphasia: Secondary | ICD-10-CM | POA: Diagnosis not present

## 2021-04-20 DIAGNOSIS — R4701 Aphasia: Secondary | ICD-10-CM | POA: Diagnosis not present

## 2021-04-20 DIAGNOSIS — I10 Essential (primary) hypertension: Secondary | ICD-10-CM | POA: Diagnosis not present

## 2021-04-20 DIAGNOSIS — G301 Alzheimer's disease with late onset: Secondary | ICD-10-CM | POA: Diagnosis not present

## 2021-04-20 DIAGNOSIS — R488 Other symbolic dysfunctions: Secondary | ICD-10-CM | POA: Diagnosis not present

## 2021-04-20 DIAGNOSIS — R627 Adult failure to thrive: Secondary | ICD-10-CM | POA: Diagnosis not present

## 2021-04-20 DIAGNOSIS — E440 Moderate protein-calorie malnutrition: Secondary | ICD-10-CM | POA: Diagnosis not present

## 2021-04-22 DIAGNOSIS — R4701 Aphasia: Secondary | ICD-10-CM | POA: Diagnosis not present

## 2021-04-22 DIAGNOSIS — R488 Other symbolic dysfunctions: Secondary | ICD-10-CM | POA: Diagnosis not present

## 2021-04-23 DIAGNOSIS — R488 Other symbolic dysfunctions: Secondary | ICD-10-CM | POA: Diagnosis not present

## 2021-04-23 DIAGNOSIS — R4701 Aphasia: Secondary | ICD-10-CM | POA: Diagnosis not present

## 2021-04-26 DIAGNOSIS — R488 Other symbolic dysfunctions: Secondary | ICD-10-CM | POA: Diagnosis not present

## 2021-04-26 DIAGNOSIS — R4701 Aphasia: Secondary | ICD-10-CM | POA: Diagnosis not present

## 2021-04-27 DIAGNOSIS — R488 Other symbolic dysfunctions: Secondary | ICD-10-CM | POA: Diagnosis not present

## 2021-04-27 DIAGNOSIS — R4701 Aphasia: Secondary | ICD-10-CM | POA: Diagnosis not present

## 2021-04-28 DIAGNOSIS — R488 Other symbolic dysfunctions: Secondary | ICD-10-CM | POA: Diagnosis not present

## 2021-04-28 DIAGNOSIS — R4701 Aphasia: Secondary | ICD-10-CM | POA: Diagnosis not present

## 2021-04-29 DIAGNOSIS — R4701 Aphasia: Secondary | ICD-10-CM | POA: Diagnosis not present

## 2021-04-29 DIAGNOSIS — R488 Other symbolic dysfunctions: Secondary | ICD-10-CM | POA: Diagnosis not present

## 2021-04-30 DIAGNOSIS — R488 Other symbolic dysfunctions: Secondary | ICD-10-CM | POA: Diagnosis not present

## 2021-04-30 DIAGNOSIS — R4701 Aphasia: Secondary | ICD-10-CM | POA: Diagnosis not present

## 2021-05-06 ENCOUNTER — Emergency Department (HOSPITAL_BASED_OUTPATIENT_CLINIC_OR_DEPARTMENT_OTHER): Payer: Medicare Other | Admitting: Radiology

## 2021-05-06 ENCOUNTER — Encounter (HOSPITAL_BASED_OUTPATIENT_CLINIC_OR_DEPARTMENT_OTHER): Payer: Self-pay | Admitting: *Deleted

## 2021-05-06 ENCOUNTER — Other Ambulatory Visit: Payer: Self-pay

## 2021-05-06 ENCOUNTER — Emergency Department (HOSPITAL_BASED_OUTPATIENT_CLINIC_OR_DEPARTMENT_OTHER)
Admission: EM | Admit: 2021-05-06 | Discharge: 2021-05-06 | Disposition: A | Discharge status: Home / Self Care | Payer: Medicare Other | Attending: Emergency Medicine | Admitting: Emergency Medicine

## 2021-05-06 DIAGNOSIS — R109 Unspecified abdominal pain: Secondary | ICD-10-CM | POA: Diagnosis not present

## 2021-05-06 DIAGNOSIS — K5900 Constipation, unspecified: Secondary | ICD-10-CM

## 2021-05-06 DIAGNOSIS — R1084 Generalized abdominal pain: Secondary | ICD-10-CM

## 2021-05-06 DIAGNOSIS — Z79899 Other long term (current) drug therapy: Secondary | ICD-10-CM | POA: Diagnosis not present

## 2021-05-06 DIAGNOSIS — Z743 Need for continuous supervision: Secondary | ICD-10-CM | POA: Diagnosis not present

## 2021-05-06 DIAGNOSIS — Z87891 Personal history of nicotine dependence: Secondary | ICD-10-CM | POA: Insufficient documentation

## 2021-05-06 DIAGNOSIS — F0390 Unspecified dementia without behavioral disturbance: Secondary | ICD-10-CM | POA: Insufficient documentation

## 2021-05-06 DIAGNOSIS — I10 Essential (primary) hypertension: Secondary | ICD-10-CM | POA: Diagnosis not present

## 2021-05-06 DIAGNOSIS — R404 Transient alteration of awareness: Secondary | ICD-10-CM | POA: Diagnosis not present

## 2021-05-06 HISTORY — DX: Unspecified dementia, unspecified severity, without behavioral disturbance, psychotic disturbance, mood disturbance, and anxiety: F03.90

## 2021-05-06 LAB — CBC
HCT: 38.9 % (ref 36.0–46.0)
Hemoglobin: 13.2 g/dL (ref 12.0–15.0)
MCH: 31.5 pg (ref 26.0–34.0)
MCHC: 33.9 g/dL (ref 30.0–36.0)
MCV: 92.8 fL (ref 80.0–100.0)
Platelets: 247 10*3/uL (ref 150–400)
RBC: 4.19 MIL/uL (ref 3.87–5.11)
RDW: 12.8 % (ref 11.5–15.5)
WBC: 8.2 10*3/uL (ref 4.0–10.5)
nRBC: 0 % (ref 0.0–0.2)

## 2021-05-06 LAB — COMPREHENSIVE METABOLIC PANEL
ALT: 12 U/L (ref 0–44)
AST: 22 U/L (ref 15–41)
Albumin: 4 g/dL (ref 3.5–5.0)
Alkaline Phosphatase: 124 U/L (ref 38–126)
Anion gap: 11 (ref 5–15)
BUN: 22 mg/dL (ref 8–23)
CO2: 23 mmol/L (ref 22–32)
Calcium: 9.2 mg/dL (ref 8.9–10.3)
Chloride: 106 mmol/L (ref 98–111)
Creatinine, Ser: 0.86 mg/dL (ref 0.44–1.00)
GFR, Estimated: >60 mL/min (ref >60)
Glucose, Bld: 98 mg/dL (ref 70–99)
Potassium: 4.2 mmol/L (ref 3.5–5.1)
Sodium: 140 mmol/L (ref 135–145)
Total Bilirubin: 0.6 mg/dL (ref 0.3–1.2)
Total Protein: 7.4 g/dL (ref 6.5–8.1)

## 2021-05-06 LAB — LIPASE, BLOOD: Lipase: 39 U/L (ref 11–51)

## 2021-05-06 MED ORDER — POLYETHYLENE GLYCOL 3350 17 GM/SCOOP PO POWD: 17.0000 g | Freq: Every day | ORAL | 0 refills | Status: AC

## 2021-05-06 NOTE — Discharge Instructions (Signed)
Please read and follow all provided instructions.  Your diagnoses today include:  1. Generalized abdominal pain   2. Abdominal pain   3. Constipation, unspecified constipation type     Tests performed today include: Blood cell counts and platelets Kidney and liver function tests Pancreas function test (called lipase) X-ray of the abdomen which suggests a large amount of stool in the intestines.  Vital signs. See below for your results today.   Medications prescribed:  Miralax - laxative  This medication can be found over-the-counter.   Take any prescribed medications only as directed.  Home care instructions:  Follow any educational materials contained in this packet.  Follow-up instructions: Please follow-up with your primary care provider in the next 2 days for further evaluation of your symptoms.    Return instructions:  SEEK IMMEDIATE MEDICAL ATTENTION IF: The pain does not go away or becomes severe  A temperature above 101F develops  Repeated vomiting occurs (multiple episodes)  The pain becomes localized to portions of the abdomen. The right side could possibly be appendicitis. In an adult, the left lower portion of the abdomen could be colitis or diverticulitis.  Blood is being passed in stools or vomit (bright red or black tarry stools)  You develop chest pain, difficulty breathing, dizziness or fainting, or become confused, poorly responsive, or inconsolable (young children) If you have any other emergent concerns regarding your health  Additional Information: Abdominal (belly) pain can be caused by many things. Your caregiver performed an examination and possibly ordered blood/urine tests and imaging (CT scan, x-rays, ultrasound). Many cases can be observed and treated at home after initial evaluation in the emergency department. Even though you are being discharged home, abdominal pain can be unpredictable. Therefore, you need a repeated exam if your pain does not  resolve, returns, or worsens. Most patients with abdominal pain don't have to be admitted to the hospital or have surgery, but serious problems like appendicitis and gallbladder attacks can start out as nonspecific pain. Many abdominal conditions cannot be diagnosed in one visit, so follow-up evaluations are very important.  Your vital signs today were: BP (!) 154/62 (BP Location: Left Arm)   Pulse 80   Temp 98 F (36.7 C) (Axillary)   Resp 16   Ht 5\' 5"  (1.651 m)   Wt 53.9 kg   LMP 07/04/1992 (Approximate)   SpO2 100%   BMI 19.79 kg/m  If your blood pressure (bp) was elevated above 135/85 this visit, please have this repeated by your doctor within one month. --------------

## 2021-05-06 NOTE — ED Notes (Signed)
S1 S2 hear Pulses good.

## 2021-05-06 NOTE — ED Triage Notes (Signed)
Pt with dementia arrives by ems from [facility] with "severe abdominal pain".  Pt is oriented to self but not able to given me any additional information.  Pt is not able to tell me that she has abdominal pain.  Pt is holding RUQ.

## 2021-05-06 NOTE — ED Notes (Signed)
Pt discharged before time. Had to wait on [facility] to transport.  Charge Rn made attempts to call facility and also left message for report

## 2021-05-06 NOTE — ED Notes (Signed)
Patient is resting comfortably, warm blankets given to pt.

## 2021-05-06 NOTE — ED Notes (Signed)
Pt apprehensive and unsure of where she is and anxious. Tech is sitiing 1 to 1 with Pt. Pt will follow instructions and is not combative. Pt did allow me to do quick cardiac assessment but was not comfortable with allowing me to isten to lungs sounds other than on the anterior side.

## 2021-05-06 NOTE — ED Provider Notes (Signed)
[facility] EMERGENCY DEPT Provider Note   CSN: [id number] Arrival date & time: 05/06/21  1058     History Chief Complaint  Patient presents with   Abdominal Pain    Shari Kelly is a 80 y.o. female.  Patient presents to the emergency department from [facility].  She was reported to have "severe abdominal pain" by EMS.  Patient has dementia and cannot give a history.  Level 5 caveat due to dementia.  Review of records does not show a history of any previous abdominal surgeries other than tubal ligation.  Patient currently appears comfortable and does not complain of any pain when asked.  She is oriented to person only.      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 Graciela Husbands feels this was a misdiagnosed problem   Uterine prolapse 09/2009   use of pessary dish 3 "    Patient Active Problem List   Diagnosis Date Noted   Memory change 06/06/2018    Class: History of   HTN (hypertension)    MITRAL VALVE DISORDERS 04/07/2009    Past Surgical History:  Procedure Laterality Date   CATARACT EXTRACTION, BILATERAL     2018   COLONOSCOPY  01/25/12   polyp recheck 3-5 years   SKIN BIOPSY     brown spot, neg per patient   TONSILLECTOMY AND ADENOIDECTOMY  age 55   TUBAL LIGATION  30's   WISDOM TOOTH EXTRACTION  mid 20's     OB History     Gravida  3   Para  2   Term  0   Preterm  0   AB  1   Living  2      SAB  1   IAB  0   Ectopic  0   Multiple  0   Live Births  2           Family History  Problem Relation Age of Onset   Hypertension Mother    Diverticulitis Mother    Heart disease Father    Hypertension Father    Rheum arthritis Sister    Diverticulitis Sister    Coronary artery disease Other     Social History   Tobacco Use   Smoking status: Former     Packs/day: 0.50    Years: 40.00    Pack years: 20.00    Types: Cigarettes    Quit date: 06/21/2006    Years since quitting: 14.8   Smokeless tobacco: Never  Substance Use Topics   Alcohol use: Not Currently   Drug use: No    Home Medications Prior to Admission medications   Medication Sig Start Date End Date Taking? Authorizing Provider  amLODipine (NORVASC) 5 MG tablet TAKE 1 TABLET BY MOUTH EVERY DAY 03/19/14  Yes Duke Salvia, MD  Ensure (ENSURE) Take 237 mLs by mouth 2 (two) times daily. For supplement   Yes [provider]  mirtazapine (REMERON) 15 MG tablet Take 15 mg by mouth at bedtime.   Yes [provider]  PRESCRIPTION MEDICATION Magic cup-use 1 time daily per [facility]   Yes [provider]  conjugated estrogens (PREMARIN) vaginal cream Use 1/2 g vaginally 2 times weekly with pessary use Patient not taking: Reported on 05/06/2021 05/08/19   Verner Chol,  CNM    Allergies    Penicillins  Review of Systems   Review of Systems  Unable to perform ROS: Dementia   Physical Exam Updated Vital Signs BP (!) 150/69 (BP Location: Right Arm)   Pulse 70   Temp 98 F (36.7 C) (Axillary)   Ht 5\' 5"  (1.651 m)   Wt 53.9 kg   LMP 07/04/1992 (Approximate)   SpO2 100%   BMI 19.79 kg/m   Physical Exam Vitals and nursing note reviewed.  Constitutional:      General: She is not in acute distress.    Appearance: She is well-developed.  HENT:     Head: Normocephalic and atraumatic.     Right Ear: External ear normal.     Left Ear: External ear normal.     Nose: Nose normal.  Eyes:     Conjunctiva/sclera: Conjunctivae normal.  Cardiovascular:     Rate and Rhythm: Normal rate and regular rhythm.     Heart sounds: No murmur heard. Pulmonary:     Effort: No respiratory distress.     Breath sounds: No wheezing, rhonchi or rales.  Abdominal:     Palpations: Abdomen is soft.     Tenderness: There is no abdominal tenderness. There is  no guarding or rebound. Negative signs include Murphy's sign and McBurney's sign.  Musculoskeletal:     Cervical back: Normal range of motion and neck supple.     Right lower leg: No edema.     Left lower leg: No edema.  Skin:    General: Skin is warm and dry.     Findings: No rash.  Neurological:     Mental Status: She is alert. Mental status is at baseline. She is disoriented.     GCS: GCS eye subscore is 4. GCS verbal subscore is 5. GCS motor subscore is 6.     Motor: No weakness.  Psychiatric:        Mood and Affect: Mood normal.    ED Results / Procedures / Treatments   Labs (all labs ordered are listed, but only abnormal results are displayed) Labs Reviewed  LIPASE, BLOOD  COMPREHENSIVE METABOLIC PANEL  CBC  URINALYSIS, ROUTINE W REFLEX MICROSCOPIC    EKG None  Radiology No results found.  Procedures Procedures   Medications Ordered in ED Medications - No data to display  ED Course  I have reviewed the triage vital signs and the nursing notes.  Pertinent labs & imaging results that were available during my care of the patient were reviewed by me and considered in my medical decision making (see chart for details).  Patient seen and examined.  At time of exam she looks very comfortable.  She has no signs of abdominal pain or tenderness.  She has not complained of any pain.  We will check labs and obtain plain film abdominal x-ray.  Will monitor.  Vital signs reviewed and are as follows: BP (!) 150/69 (BP Location: Right Arm)   Pulse 70   Temp 98 F (36.7 C) (Axillary)   Ht 5\' 5"  (1.651 m)   Wt 53.9 kg   LMP 07/04/1992 (Approximate)   SpO2 100%   BMI 19.79 kg/m   1:02 PM patient reexamined several times.  She continues to appear comfortable and in no distress.  X-ray shows large stool burden.  Blood work is completely normal.  As patient is otherwise asymptomatic, do not feel that she requires advanced imaging at this time.  MiraLAX recommended for  constipation.  Otherwise, patient will be discharged with close return instructions in case symptoms change or worsen.   MDM Rules/Calculators/A&P                           Patient with reported abdominal pain --but now appears asymptomatic.  History and exam complicated by patient's history of dementia.  Vitals are stable, no fever. Labs are completely normal. Imaging plain film difficult to obtain, however does show large stool burden. No signs of dehydration, patient is tolerating PO's. Lungs are clear and no signs suggestive of PNA. Low concern for appendicitis, cholecystitis, pancreatitis, ruptured viscus, UTI, kidney stone, aortic dissection, aortic aneurysm or other emergent abdominal etiology. Supportive therapy indicated with return if symptoms worsen.   Final Clinical Impression(s) / ED Diagnoses Final diagnoses:  Abdominal pain    Rx / DC Orders ED Discharge Orders          Ordered    polyethylene glycol powder (GLYCOLAX/MIRALAX) 17 GM/SCOOP powder  Daily        05/06/21 1302             Renne Crigler, PA-C 05/06/21 1304    Ernie Avena, MD 05/06/21 1332

## 2021-08-04 DEATH — deceased

## 2023-01-27 IMAGING — DX DG ABDOMEN 1V
2 series · 2 of 2 positions shown · non-contrast
Comparison: None

CLINICAL DATA: Severe dementia, abdominal pain

EXAM:
ABDOMEN - 1 VIEW

[abdomen supine (1 of 2)]
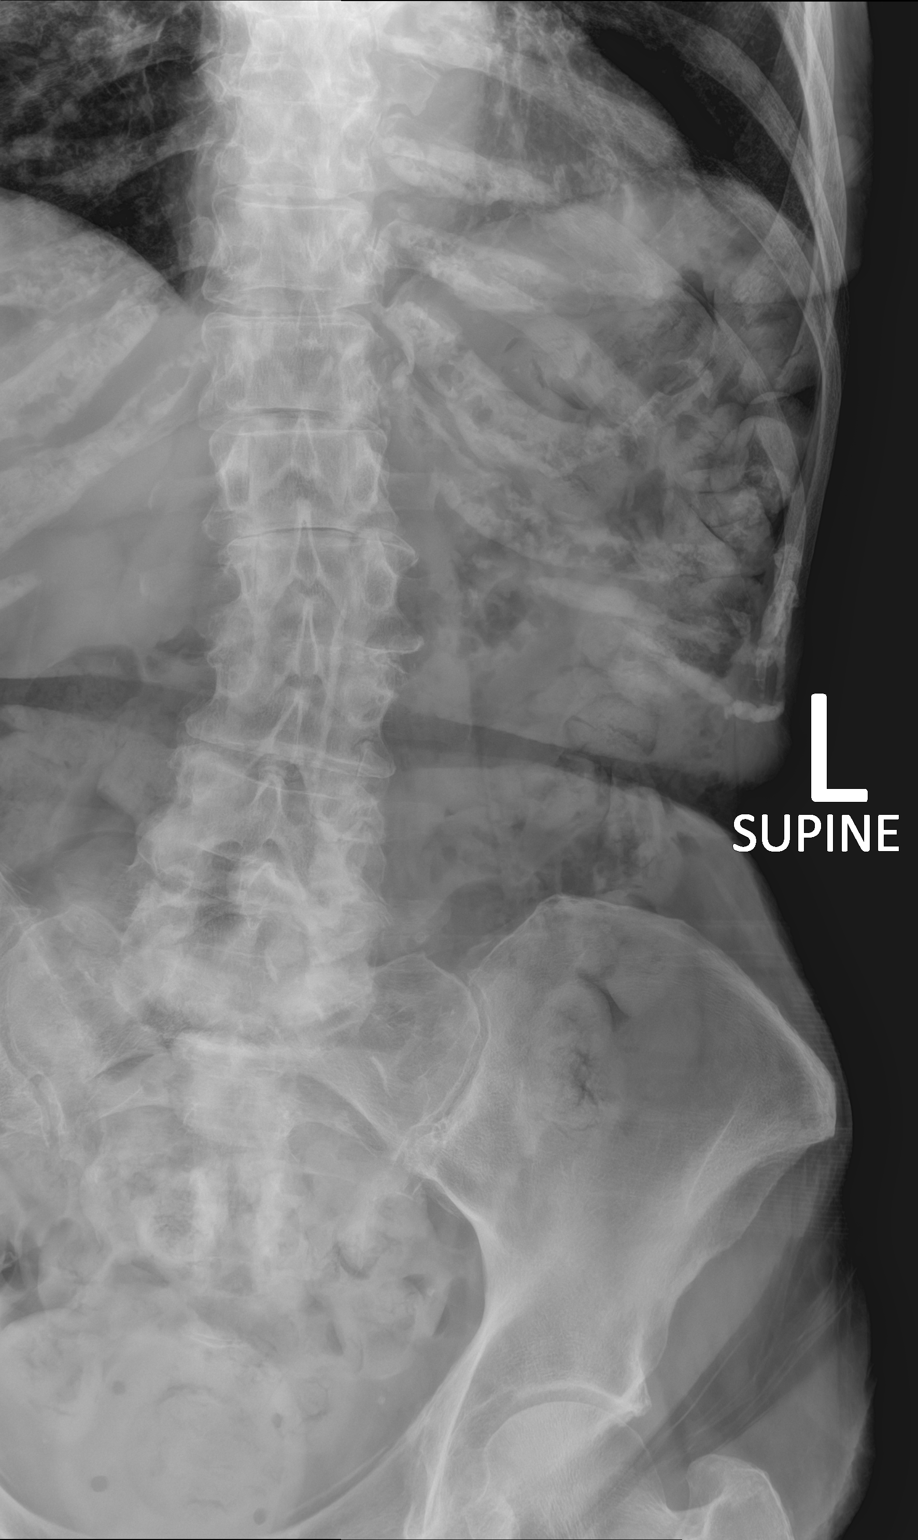

[abdomen supine (2 of 2)]
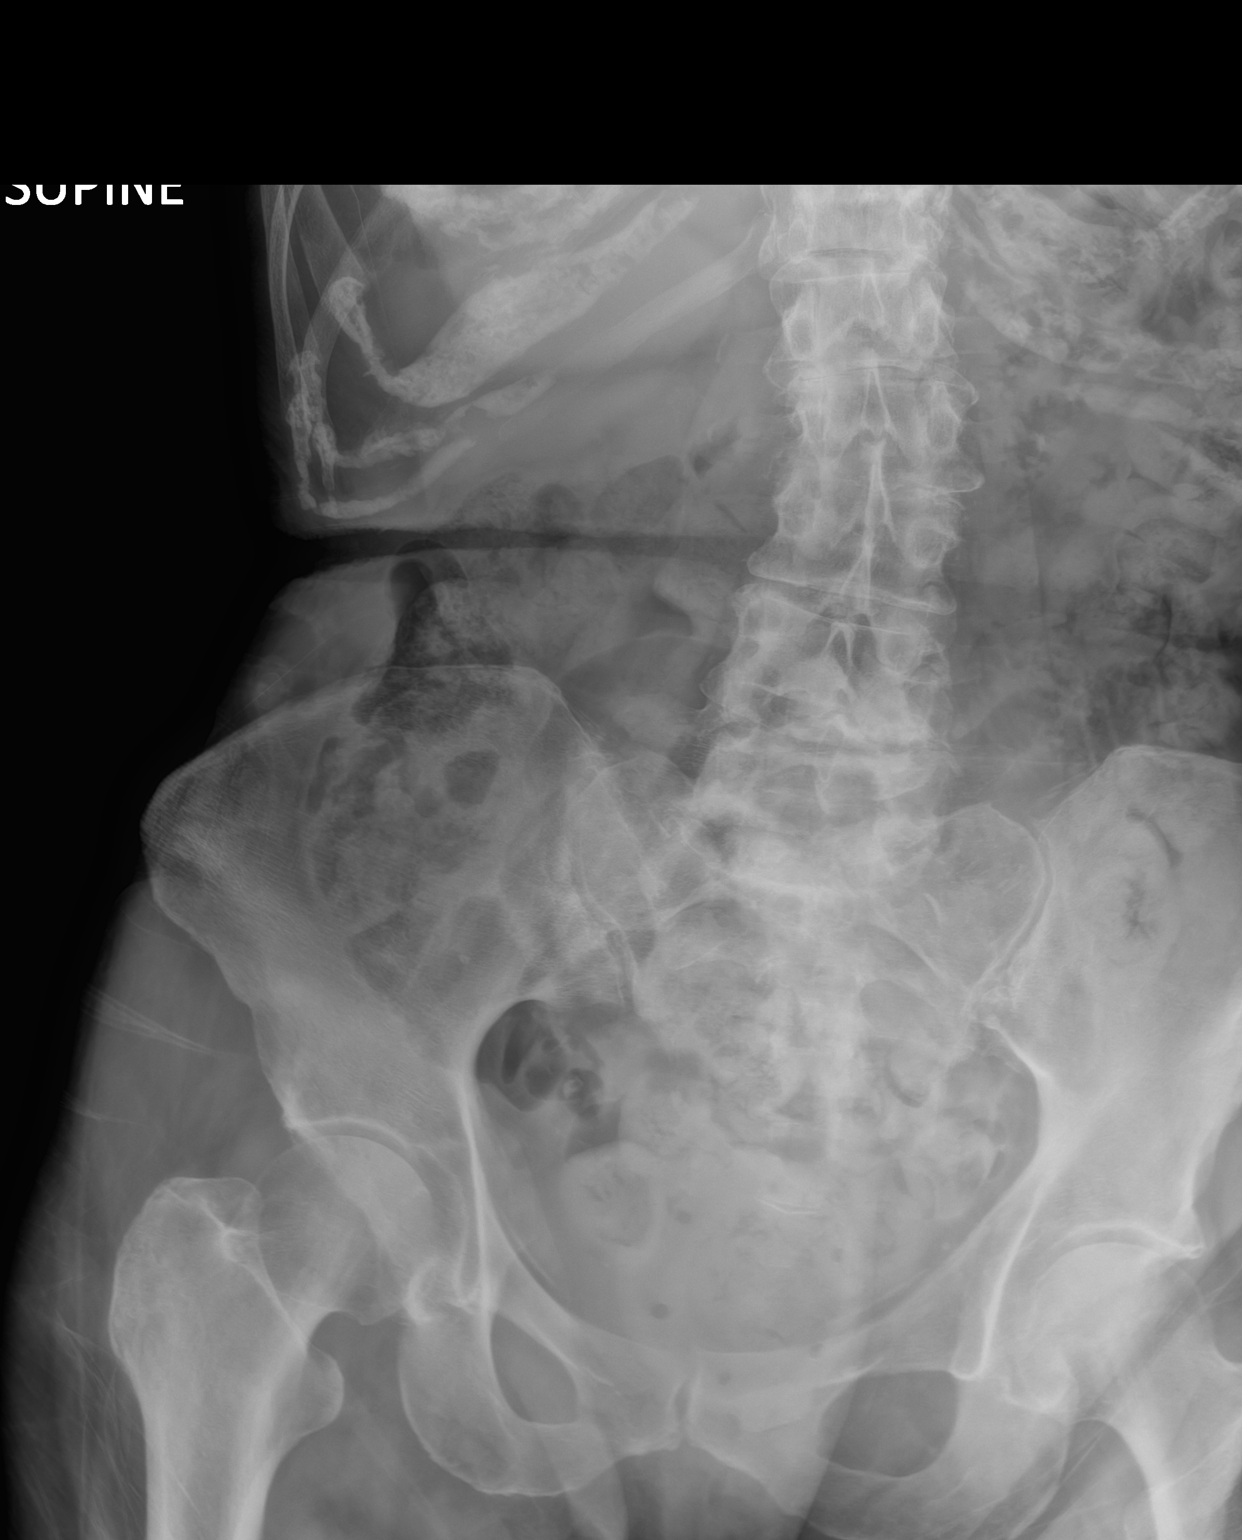

[2 of 2 positions shown; findings below may reference images not displayed]

FINDINGS: Increased stool throughout colon to rectum.

No bowel dilatation or bowel wall thickening.

Lung bases clear.

Osseous demineralization with levoconvex lumbar spine.

No definite urinary tract calcification.
IMPRESSION: Increased stool throughout colon to rectum.
